# Patient Record
Sex: Male | Born: 1950 | Race: White | Hispanic: No | Marital: Married | Smoking: Never smoker
Health system: Southern US, Community
[De-identification: ages and names within clinical notes are randomized; demographics above are authoritative.]

## PROBLEM LIST (undated history)

## (undated) DIAGNOSIS — M1991 Primary osteoarthritis, unspecified site: Secondary | ICD-10-CM

## (undated) DIAGNOSIS — K219 Gastro-esophageal reflux disease without esophagitis: Secondary | ICD-10-CM

## (undated) DIAGNOSIS — M1990 Unspecified osteoarthritis, unspecified site: Secondary | ICD-10-CM

## (undated) DIAGNOSIS — J189 Pneumonia, unspecified organism: Secondary | ICD-10-CM

## (undated) DIAGNOSIS — Z87440 Personal history of urinary (tract) infections: Secondary | ICD-10-CM

## (undated) DIAGNOSIS — R112 Nausea with vomiting, unspecified: Secondary | ICD-10-CM

## (undated) DIAGNOSIS — Z8719 Personal history of other diseases of the digestive system: Secondary | ICD-10-CM

## (undated) DIAGNOSIS — T8859XA Other complications of anesthesia, initial encounter: Secondary | ICD-10-CM

## (undated) DIAGNOSIS — Z9889 Other specified postprocedural states: Secondary | ICD-10-CM

## (undated) DIAGNOSIS — Z8781 Personal history of (healed) traumatic fracture: Secondary | ICD-10-CM

## (undated) DIAGNOSIS — E063 Autoimmune thyroiditis: Secondary | ICD-10-CM

## (undated) DIAGNOSIS — D649 Anemia, unspecified: Secondary | ICD-10-CM

## (undated) DIAGNOSIS — M75101 Unspecified rotator cuff tear or rupture of right shoulder, not specified as traumatic: Secondary | ICD-10-CM

## (undated) HISTORY — DX: Gastro-esophageal reflux disease without esophagitis: K21.9

## (undated) HISTORY — DX: Unspecified rotator cuff tear or rupture of right shoulder, not specified as traumatic: M75.101

## (undated) HISTORY — DX: Autoimmune thyroiditis: E06.3

## (undated) HISTORY — PX: HERNIA REPAIR: SHX51

## (undated) HISTORY — PX: BACK SURGERY: SHX140

## (undated) HISTORY — DX: Primary osteoarthritis, unspecified site: M19.91

---

## 1999-06-01 ENCOUNTER — Inpatient Hospital Stay (HOSPITAL_COMMUNITY): Admission: EM | Admit: 1999-06-01 | Discharge: 1999-06-04 | Payer: Self-pay | Admitting: Emergency Medicine

## 1999-06-01 ENCOUNTER — Encounter: Payer: Self-pay | Admitting: Emergency Medicine

## 1999-06-22 ENCOUNTER — Encounter: Admission: RE | Admit: 1999-06-22 | Discharge: 1999-06-22 | Payer: Self-pay | Admitting: Neurosurgery

## 1999-06-22 ENCOUNTER — Encounter: Payer: Self-pay | Admitting: Neurosurgery

## 1999-08-04 ENCOUNTER — Encounter: Admission: RE | Admit: 1999-08-04 | Discharge: 1999-08-04 | Payer: Self-pay | Admitting: Neurosurgery

## 1999-08-04 ENCOUNTER — Encounter: Payer: Self-pay | Admitting: Neurosurgery

## 1999-09-17 ENCOUNTER — Encounter: Payer: Self-pay | Admitting: Neurosurgery

## 1999-09-17 ENCOUNTER — Encounter: Admission: RE | Admit: 1999-09-17 | Discharge: 1999-09-17 | Payer: Self-pay | Admitting: Neurosurgery

## 1999-10-25 ENCOUNTER — Encounter: Admission: RE | Admit: 1999-10-25 | Discharge: 1999-10-25 | Payer: Self-pay | Admitting: Neurosurgery

## 1999-10-25 ENCOUNTER — Encounter: Payer: Self-pay | Admitting: Neurosurgery

## 2003-06-05 ENCOUNTER — Ambulatory Visit (HOSPITAL_COMMUNITY): Admission: RE | Admit: 2003-06-05 | Discharge: 2003-06-05 | Payer: Self-pay | Admitting: Family Medicine

## 2003-07-23 ENCOUNTER — Ambulatory Visit (HOSPITAL_COMMUNITY): Admission: RE | Admit: 2003-07-23 | Discharge: 2003-07-23 | Payer: Self-pay | Admitting: Internal Medicine

## 2004-06-17 ENCOUNTER — Ambulatory Visit (HOSPITAL_COMMUNITY): Admission: RE | Admit: 2004-06-17 | Discharge: 2004-06-17 | Payer: Self-pay | Admitting: Family Medicine

## 2004-09-03 ENCOUNTER — Ambulatory Visit (HOSPITAL_COMMUNITY): Admission: RE | Admit: 2004-09-03 | Discharge: 2004-09-03 | Payer: Self-pay | Admitting: Family Medicine

## 2005-02-07 ENCOUNTER — Emergency Department (HOSPITAL_COMMUNITY): Admission: EM | Admit: 2005-02-07 | Discharge: 2005-02-07 | Payer: Self-pay | Admitting: Emergency Medicine

## 2005-04-21 ENCOUNTER — Emergency Department (HOSPITAL_COMMUNITY): Admission: EM | Admit: 2005-04-21 | Discharge: 2005-04-21 | Payer: Self-pay | Admitting: Emergency Medicine

## 2007-05-25 ENCOUNTER — Ambulatory Visit (HOSPITAL_COMMUNITY): Admission: RE | Admit: 2007-05-25 | Discharge: 2007-05-25 | Payer: Self-pay | Admitting: Family Medicine

## 2007-12-17 ENCOUNTER — Ambulatory Visit (HOSPITAL_COMMUNITY): Admission: RE | Admit: 2007-12-17 | Discharge: 2007-12-17 | Payer: Self-pay | Admitting: Family Medicine

## 2008-04-03 ENCOUNTER — Ambulatory Visit: Payer: Self-pay | Admitting: Internal Medicine

## 2008-04-18 ENCOUNTER — Ambulatory Visit (HOSPITAL_COMMUNITY): Admission: RE | Admit: 2008-04-18 | Discharge: 2008-04-18 | Payer: Self-pay | Admitting: Internal Medicine

## 2008-04-18 ENCOUNTER — Ambulatory Visit: Payer: Self-pay | Admitting: Internal Medicine

## 2008-04-18 ENCOUNTER — Encounter: Payer: Self-pay | Admitting: Internal Medicine

## 2008-04-18 HISTORY — PX: COLONOSCOPY: SHX174

## 2008-04-18 HISTORY — PX: ESOPHAGOGASTRODUODENOSCOPY: SHX1529

## 2008-08-26 ENCOUNTER — Ambulatory Visit: Payer: Self-pay | Admitting: Internal Medicine

## 2008-09-18 ENCOUNTER — Ambulatory Visit (HOSPITAL_COMMUNITY): Admission: RE | Admit: 2008-09-18 | Discharge: 2008-09-18 | Payer: Self-pay | Admitting: Family Medicine

## 2009-04-30 ENCOUNTER — Encounter: Payer: Self-pay | Admitting: Urgent Care

## 2010-06-30 ENCOUNTER — Ambulatory Visit (HOSPITAL_COMMUNITY)
Admission: RE | Admit: 2010-06-30 | Discharge: 2010-06-30 | Payer: Self-pay | Source: Home / Self Care | Attending: Orthopedic Surgery | Admitting: Orthopedic Surgery

## 2010-07-18 HISTORY — PX: OTHER SURGICAL HISTORY: SHX169

## 2010-08-07 ENCOUNTER — Encounter: Payer: Self-pay | Admitting: Family Medicine

## 2010-08-08 ENCOUNTER — Encounter: Payer: Self-pay | Admitting: Family Medicine

## 2010-08-19 ENCOUNTER — Encounter (INDEPENDENT_AMBULATORY_CARE_PROVIDER_SITE_OTHER): Payer: Self-pay | Admitting: *Deleted

## 2010-08-25 NOTE — Letter (Signed)
Summary: Recall Office Visit  [facility]  [address]   [place], [place] [address]   Phone: [phone]  Fax: [phone]      August 19, 2010   DAKOTAH ORREGO [address] [place], [place]  [address] 11/18/1950   Dear Mr. GASAWAY,   According to our records, it is time for you to schedule a follow-up office visit with [place].   At your convenience, please call [phone] to schedule an office visit. If you have any questions, concerns, or feel that this letter is in error, we would appreciate your call.   Sincerely,    Diana Eves  [facility] Ph: [phone]   Fax: [phone]

## 2010-11-30 NOTE — Op Note (Signed)
NAME:  Antonio, Daniel             ACCOUNT NO.:  [id number]   MEDICAL RECORD NO.:  [id number]          PATIENT TYPE:  AMB   LOCATION:  DAY                           FACILITY:  [facility]   PHYSICIAN:  R. Roetta Sessions, M.D. DATE OF BIRTH:  1950/07/20   DATE OF PROCEDURE:  DATE OF DISCHARGE:                               OPERATIVE REPORT   EGD with Elease Hashimoto dilation followed by biopsy followed by screening  colonoscopy.   INDICATIONS FOR PROCEDURE:  A 60 year old gentleman with long-standing  esophageal dysphagia, saw him back in 2005.  Did an EGD, found a  Schatzki ring.  It was dilated up to a 58-French size back then, but  tells me that dysphagia did not really improve at all.  He has some  degree of reflux symptoms for which she is taking a variety of PPIs and  Maalox with minimal improvement in symptoms.  He is here for repeat  examination and esophageal dilation, further mastication as appropriate  as well as a screening colonoscopy.  He has never had a colonoscopy, and  he has no family history for colorectal neoplasia.  He has no lower GI  tract symptoms.  Risks, benefits, alternative, limitations of this  approach have been reviewed, questions answered.  Please see  documentation in the medical record.   PROCEDURE NOTE:  O2 saturation, blood pressure, pulse, and respirations  were monitored throughout the entire procedure.   CONSCIOUS SEDATION:  Versed 5 mg IV, Demerol 125 mg IV in divided doses.  Cetacaine spray for topical pharyngeal anesthesia.   INSTRUMENTATION:  Pentax video chip system.   EGD FINDINGS:  Examination of the tubular esophagus revealed somewhat  pale-appearing esophageal mucosa.  There were 3 distal rings with  overlying esophageal erosion on the distal ring.  I did not really see  any furrowing and would not call this a feline esophagus, although it  did have 3 distal rings,  I passed through this with the scope without  any difficulty whatsoever.   Otherwise, the esophageal mucosa appeared  unremarkable.   STOMACH:  Gas cavity was emptied, insufflated well with air.  A thorough  examination of the gastric mucosa including retroflexion of the proximal  stomach esophagogastric junction demonstrated only a small hiatal  hernia.  Pylorus patent, easily traversed.  Examination of the bulb and  second portion revealed no abnormalities.   THERAPEUTIC/DIAGNOSTIC MANEUVERS PERFORMED:  Scope was withdrawn, and a  56-French Maloney dilator was passed to full insertion with ease.  A  look back revealed what appeared to be a superficial tear of the  esophageal mucosa straddling the distal ring at the EG junction,  otherwise no apparent complication related to passage of the dilator.  I  elected not to go with any larger border dilators.  I subsequently  biopsied the distal and mid esophageal body to evaluate further for  possible eosinophilic esophagitis.  The patient tolerated the procedure  well and was prepared for colonoscopy.  Digital rectal exam revealed no  abnormalities.   ENDOSCOPIC FINDINGS:  The prep was good.   COLON:  Colonic mucosa was surveyed  from the rectosigmoid junction  through the left transverse, right colon, appendiceal orifice, ileocecal  valve, and cecum.  These structures were well seen and photographed for  the record.  From this level, the scope was slowly withdrawn.  All  previously mentioned mucosal surfaces were again seen.  The patient had  scattered left-sided diverticula and colonic mucosa appeared normal.  Scope was pulled down the rectum.  A thorough examination of the rectal  mucosa including retroflexion of the distal rectum revealed no  abnormalities.  The patient tolerated both procedures well and was  reactive in endoscopy.   IMPRESSION:  Esophagogastroduodenoscopy, somewhat pale-appearing  esophageal mucosa, 3 distal esophageal ring status post dilation  described above status post esophageal  biopsy, small hiatal hernia  otherwise normal stomach, D1 and D2.   COLONOSCOPY FINDINGS:  Normal rectum, scattered left-sided diverticula,  colonic mucosa appeared normal.   RECOMMENDATIONS:  1. Diverticulosis.  Literature provided to Antonio Daniel.  2. Followup on path.  3. Carafate 1 g slurry, use q.i.d. x3 days.  Get back on a PPI with      Prilosec 20 g orally daily.  I have discussed my findings and      recommendations with Antonio Daniel and his girlfriend, Tammy.      Further recommendations to follow.      Jonathon Bellows, M.D.  Electronically Signed     RMR/MEDQ  D:  04/18/2008  T:  04/18/2008  Job:  [id number]   cc:   Corrie Mckusick, M.D.  Fax: [phone]
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
## 2010-11-30 NOTE — Assessment & Plan Note (Signed)
NAME:  Antonio Daniel, Antonio Daniel              CHART#:  [id number]   DATE:  08/26/2008                       DOB:  [birth year]   PROBLEM LIST:  Gastroesophageal reflux disease, history of esophageal  dysphagia resolved status post dilation Schatzki's ring 04/18/2008.  The  patient has done very well.  Reflux symptoms have been fairly well  squelched with Prilosec 20 mg orally daily.  He is out.  He is having no  further dysphagia.  His esophagus did appear somewhat pale and he  actually had three distal rings in tandem.  After dilation I did perform  biopsies and features were most consistent of reflux esophagitis and not  eosinophilic esophagitis.  Left-sided diverticula otherwise normal colon  and rectum on 04/18/2008.  Screening colonoscopy due for routine  screening in 10 years.   MEDICATIONS:  See updated list.   ALLERGIES:  No known drug allergies.   PHYSICAL EXAMINATION:  GENERAL:  He appears well.  VITAL SIGNS:  Weight 184, height 5 feet 9 inches.  Temperature 98.1, BP  110/70, pulse 72.  SKIN:  Warm and dry.  CHEST:  Lungs are clear to auscultation.  CARDIAC:  Regular rate and rhythm without murmur, gallop or rub.  ABDOMEN:  Nondistended, positive bowel sounds, soft, nontender.   ASSESSMENT:  1. Gastroesophageal reflux disease symptoms much improved on proton      pump inhibitor therapy with omeprazole.  2. Dysphagia resolved status post dilation of his rings.   RECOMMENDATIONS:  Emphasize the chronic nature of gastroesophageal  reflux disease and the need to be on chronic acid suppression therapy.  He has done well on Prilosec.  We will give him a refill for omeprazole  20 mg orally daily (2 years).  He may or may not, I told him he may or  may not need another dilation in the future, any recurrence of dysphagia  symptoms would  dictate that approach.  Unless something comes up the plan is to see  this nice gentleman back in the office in 2 years.       Jonathon Bellows,  M.D.  Electronically Signed     RMR/MEDQ  D:  08/27/2008  T:  08/27/2008  Job:  [id number]   cc:   Dr Phillips Odor
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
## 2010-12-03 NOTE — Consult Note (Signed)
NAME:  Antonio Daniel, Antonio Daniel NO.:  [id number]   MEDICAL RECORD NO.:  [id number]                  PATIENT TYPE:   LOCATION:                                       FACILITY:   PHYSICIAN:  Lionel December, M.D.                 DATE OF BIRTH:  05-26-1951   DATE OF CONSULTATION:  07/02/2003  DATE OF DISCHARGE:                                   CONSULTATION   REQUESTING PHYSICIAN:  Corrie Mckusick, M.D.   CONSULTING PHYSICIAN:  Lionel December, M.D.   REASON FOR CONSULTATION:  Dysphagia, abnormal esophogram.   HISTORY OF PRESENT ILLNESS:  Antonio Daniel is a 60 year old Caucasian  gentleman, a patient of Dr. Assunta Daniel, who presents today for further  evaluation of the above stated symptoms.  He has had chronic dysphagia to  solid foods for several months to a year.  He particularly notes this when  eating meats or breads.  A few times, he has had to force the food back  pulse because it would not go down.  He has also had heartburn for more than  five years.  He takes Maalox tablets p.r.n.  He denies any odynophagia,  nausea, abdominal pain, constipation, diarrhea, melena or rectal bleeding.  He recent had a barium esophogram which revealed a small sliding hiatal  hernia with a Schatzki ring seen above the hernia, measuring 16- to -17-mm  in diameter.  A 13-mm barium tablet passed easily.   CURRENT MEDICATIONS:  1. Advil p.r.n.  2. Maalox tablets p.r.n.   ALLERGIES:  No known drug allergies.   PAST MEDICAL HISTORY:  Recent history of UTI/prostatitis.   PAST SURGICAL HISTORY:  Left inguinal hernia repair.   FAMILY HISTORY:  Mother died of leukemia at age 84.  Father died of  pancreatic cancer age 87.  No family history of colorectal cancer to his  knowledge.   SOCIAL HISTORY:  He is divorced.  He has two children.  He works with [employer].  He has never been a smoker.  Denies any alcohol  use.   REVIEW OF SYSTEMS:  Please see HPI for  GI.  GENERAL:  Denies any weight  loss.  CARDIOPULMONARY:  Denies any chest pain or shortness of breath.   PHYSICAL EXAMINATION:  VITAL SIGNS:  Weight 181, height 5 foot 9, temp 98.3,  blood pressure 124/88, pulse 72.  GENERAL:  A pleasant, well nourished, well developed Caucasian male in no  acute distress.  SKIN:  Warm and dry.  No jaundice.  HEENT:  Conjunctivae are pink.  Sclerae are nonicteric.  Oropharyngeal  mucosa is moist and pink.  No lesions, erythema or exudate.  No  lymphadenopathy, thyromegaly.  CHEST:  Lungs are clear to auscultation.  CARDIAC:  Reveals a regular, rate and rhythm.  Normal S1, S2.  No murmurs,  rubs or gallops.  ABDOMEN:  Positive bowel sounds.  Soft,  nontender, nondistended.  No  organomegaly or masses.  EXTREMITIES:  No edema.   IMPRESSION:  The patient is a 60 year old gentleman with chronic  gastroesophageal reflux disease and chronic solid food esophageal dysphagia.  He has a Schatzki ring on the barium esophogram with a 13-mm barium tablet  passing easily, with larger boluses, this ring may be an issue however.   RECOMMENDATIONS:  Recommend upper endoscopy both for esophageal dilatation  as well as surveillance for complications of GERD, i.e., Barrett's  esophagus.   PLAN:  1. EGD with dilatation near future.  2. He will begin Protonix 40 mg daily, #4 boxes of samples given.  3. Further recommendations to follow.   I would like to thank Dr. Assunta Daniel for allowing [place] to take part in the  care of this patient.     ________________________________________  ___________________________________________  Tana Coast, P.A.                         Lionel December, M.D.   LL/MEDQ  D:  07/02/2003  T:  07/02/2003  Job:  [id number]   cc:   Corrie Mckusick, M.D.  [address] [place] [place]  [place]  [place] [address]  Fax: [phone]   Lionel December, M.D.  [address]    [place] [address]  Fax: [phone]

## 2010-12-03 NOTE — Op Note (Signed)
NAME:  KYMANI, SHIMABUKURO                       ACCOUNT NO.:  [id number]   MEDICAL RECORD NO.:  [id number]                   PATIENT TYPE:  AMB   LOCATION:  DAY                                  FACILITY:  [facility]   PHYSICIAN:  Lionel December, M.D.                 DATE OF BIRTH:  Sep 17, 1950   DATE OF PROCEDURE:  DATE OF DISCHARGE:                                 OPERATIVE REPORT   I just dictated a note.  I just wanted to make sure that Dr. Phillips Odor gets a  copy.      ___________________________________________                                            Lionel December, M.D.   NR/MEDQ  D:  07/23/2003  T:  07/23/2003  Job:  [id number]   cc:   Corrie Mckusick, M.D.  [address] [place] [place]  [place]  [place] [address]  Fax: [phone]

## 2010-12-03 NOTE — Op Note (Signed)
NAME:  Antonio Daniel, MAKER                       ACCOUNT NO.:  [id number]   MEDICAL RECORD NO.:  [id number]                   PATIENT TYPE:  AMB   LOCATION:  DAY                                  FACILITY:  [facility]   PHYSICIAN:  Lionel December, M.D.                 DATE OF BIRTH:  Mar 08, 1951   DATE OF PROCEDURE:  DATE OF DISCHARGE:                                 OPERATIVE REPORT   PROCEDURE:  Esophagogastroduodenoscopy with esophageal dilatation.   ENDOSCOPIST:  Lionel December, M.D.   INDICATIONS:  Gabriel Rung is a 60 year old Caucasian male with a several year  history of intermittent solid food dysphagia who recently had a barium study  which revealed a small sliding hiatal hernia and Schatzki ring.  However,  this was not felt to be critical and barium filled past this area without  any delay.  It is felt that he would benefit from having this ring  disrupted. He also has intermittent heartburn.  This is better with PPI.   Procedure and risks were reviewed with the patient and informed consent was  obtained.   PREOPERATIVE MEDICATIONS:  Cetacaine spray for oropharyngeal topical  anesthesia, Demerol 50 mg IV and Versed 6 mg IV in divided dose.   FINDINGS:  Procedure performed in endoscopy suite.  The patient's vital  signs and O2 saturation were monitored during the procedure and remained  stable.  The patient was placed in the left lateral recumbent position and  Olympus videoscope was passed via the oropharynx without any difficulty into  the esophagus.   ESOPHAGUS:  Mucosa of the esophagus was normal.  There was an incomplete  ring about 2 cm proximal to the GE junction and a Schatzki ring at the GE  junction which was not high grade. There was a 4 cm long, sliding, hiatal  hernia.  Diaphragmatic hiatus was at 41 cm from incisors.   STOMACH:  It was empty and distended very well with insufflation.  The folds  of the proximal stomach were normal.  Examination of the mucosa revealed  antral erythema and granularity.  The pyloric channel was patent.  Angularis, fundus, and cardia were examined by retroflexing the scope and  were normal.   DUODENUM:  Examination of the bulb revealed patchy erythema and granularity,  but no erosions or ulcers noted.  The mucosa and folds in the second part of  the duodenum were normal. Endoscope was withdrawn.   The esophagus was dilated by passing 58 [place] Maloney dilator to full  insertion.  The esophagus was reexamined post dilatation and the ring was  noted to have been disrupted.  Pictures taken for the record.   The endoscope was withdrawn.  The patient tolerated the procedure well.   FINAL DIAGNOSES:  1. Schatzki ring just dilated and disrupted by passing 58 [place] Maloney     dilator.  2. Small, sliding, hiatal hernia.  3. Nonerosive antral gastritis  and bulbar duodenitis.   RECOMMENDATIONS:  1. Antireflux measures stressed to the patient.  2. Omeprazole 20 mg p.o. q.a.m.  3. H. pylori serology will be checked today. I will be contacting the     patient with the results.      ___________________________________________                                            Lionel December, M.D.   NR/MEDQ  D:  07/23/2003  T:  07/23/2003  Job:  [id number]

## 2012-05-16 ENCOUNTER — Other Ambulatory Visit (HOSPITAL_COMMUNITY): Payer: Self-pay | Admitting: Chiropractic Medicine

## 2012-05-16 DIAGNOSIS — M999 Biomechanical lesion, unspecified: Secondary | ICD-10-CM

## 2012-05-18 ENCOUNTER — Ambulatory Visit (HOSPITAL_COMMUNITY)
Admission: RE | Admit: 2012-05-18 | Discharge: 2012-05-18 | Disposition: A | Discharge status: Home / Self Care | Payer: 59 | Source: Ambulatory Visit | Attending: Chiropractic Medicine | Admitting: Chiropractic Medicine

## 2012-05-18 DIAGNOSIS — M999 Biomechanical lesion, unspecified: Secondary | ICD-10-CM

## 2012-05-18 DIAGNOSIS — S83289A Other tear of lateral meniscus, current injury, unspecified knee, initial encounter: Secondary | ICD-10-CM | POA: Insufficient documentation

## 2012-05-18 DIAGNOSIS — M25569 Pain in unspecified knee: Secondary | ICD-10-CM | POA: Insufficient documentation

## 2012-05-18 DIAGNOSIS — IMO0002 Reserved for concepts with insufficient information to code with codable children: Secondary | ICD-10-CM | POA: Insufficient documentation

## 2012-05-18 DIAGNOSIS — M25469 Effusion, unspecified knee: Secondary | ICD-10-CM | POA: Insufficient documentation

## 2012-05-18 DIAGNOSIS — M7120 Synovial cyst of popliteal space [Baker], unspecified knee: Secondary | ICD-10-CM | POA: Insufficient documentation

## 2012-05-18 DIAGNOSIS — X58XXXA Exposure to other specified factors, initial encounter: Secondary | ICD-10-CM | POA: Insufficient documentation

## 2013-01-28 ENCOUNTER — Other Ambulatory Visit (HOSPITAL_COMMUNITY): Payer: Self-pay | Admitting: Chiropractic Medicine

## 2013-01-28 DIAGNOSIS — M545 Low back pain: Secondary | ICD-10-CM

## 2013-01-29 ENCOUNTER — Ambulatory Visit (HOSPITAL_COMMUNITY)
Admission: RE | Admit: 2013-01-29 | Discharge: 2013-01-29 | Disposition: A | Discharge status: Home / Self Care | Payer: 59 | Source: Ambulatory Visit | Attending: Chiropractic Medicine | Admitting: Chiropractic Medicine

## 2013-01-29 DIAGNOSIS — M5126 Other intervertebral disc displacement, lumbar region: Secondary | ICD-10-CM | POA: Insufficient documentation

## 2013-01-29 DIAGNOSIS — M5137 Other intervertebral disc degeneration, lumbosacral region: Secondary | ICD-10-CM | POA: Insufficient documentation

## 2013-01-29 DIAGNOSIS — M545 Low back pain, unspecified: Secondary | ICD-10-CM | POA: Insufficient documentation

## 2013-01-29 DIAGNOSIS — IMO0002 Reserved for concepts with insufficient information to code with codable children: Secondary | ICD-10-CM | POA: Insufficient documentation

## 2013-01-29 DIAGNOSIS — M51379 Other intervertebral disc degeneration, lumbosacral region without mention of lumbar back pain or lower extremity pain: Secondary | ICD-10-CM | POA: Insufficient documentation

## 2013-04-23 ENCOUNTER — Other Ambulatory Visit: Payer: Self-pay | Admitting: Orthopedic Surgery

## 2013-05-02 ENCOUNTER — Other Ambulatory Visit: Payer: Self-pay | Admitting: Orthopedic Surgery

## 2013-05-03 ENCOUNTER — Encounter (HOSPITAL_COMMUNITY): Payer: Self-pay | Admitting: Pharmacy Technician

## 2013-05-08 ENCOUNTER — Encounter (HOSPITAL_COMMUNITY)
Admission: RE | Admit: 2013-05-08 | Discharge: 2013-05-08 | Disposition: A | Discharge status: Home / Self Care | Payer: 59 | Source: Ambulatory Visit | Attending: Orthopedic Surgery | Admitting: Orthopedic Surgery

## 2013-05-08 ENCOUNTER — Encounter (HOSPITAL_COMMUNITY): Payer: Self-pay

## 2013-05-08 DIAGNOSIS — Z01812 Encounter for preprocedural laboratory examination: Secondary | ICD-10-CM | POA: Insufficient documentation

## 2013-05-08 HISTORY — DX: Unspecified osteoarthritis, unspecified site: M19.90

## 2013-05-08 LAB — SURGICAL PCR SCREEN
MRSA, PCR: NEGATIVE
Staphylococcus aureus: POSITIVE — AB

## 2013-05-08 LAB — PROTIME-INR: Prothrombin Time: 12.8 seconds (ref 11.6–15.2)

## 2013-05-08 LAB — ABO/RH: ABO/RH(D): O POS

## 2013-05-08 NOTE — Patient Instructions (Signed)
YOUR SURGERY IS SCHEDULED AT [facility]  ON:  Monday  10/27  REPORT TO [facility] AT:  7:30 AM      PHONE # FOR SHORT STAY IS [phone]  DO NOT EAT OR DRINK ANYTHING AFTER MIDNIGHT THE NIGHT BEFORE YOUR SURGERY.  YOU MAY BRUSH YOUR TEETH, RINSE OUT YOUR MOUTH--BUT NO WATER, NO FOOD, NO CHEWING GUM, NO MINTS, NO CANDIES, NO CHEWING TOBACCO.  PLEASE TAKE THE FOLLOWING MEDICATIONS THE AM OF YOUR SURGERY WITH A FEW SIPS OF WATER:  HYDROCODONE  / ACETAMINOPHEN  FOR PAIN IF NEEDED     DO NOT BRING VALUABLES, MONEY, CREDIT CARDS.  DO NOT WEAR JEWELRY, MAKE-UP, NAIL POLISH AND NO METAL PINS OR CLIPS IN YOUR HAIR. CONTACT LENS, DENTURES / PARTIALS, GLASSES SHOULD NOT BE WORN TO SURGERY AND IN MOST CASES-HEARING AIDS WILL NEED TO BE REMOVED.  BRING YOUR GLASSES CASE, ANY EQUIPMENT NEEDED FOR YOUR CONTACT LENS. FOR PATIENTS ADMITTED TO THE HOSPITAL--CHECK OUT TIME THE DAY OF DISCHARGE IS 11:00 AM.  ALL INPATIENT ROOMS ARE PRIVATE - WITH BATHROOM, TELEPHONE, TELEVISION AND WIFI INTERNET.                               PLEASE READ OVER ANY  FACT SHEETS THAT YOU WERE GIVEN: MRSA INFORMATION, BLOOD TRANSFUSION INFORMATION, INCENTIVE SPIROMETER INFORMATION. FAILURE TO FOLLOW THESE INSTRUCTIONS MAY RESULT IN THE CANCELLATION OF YOUR SURGERY.   PATIENT SIGNATURE_________________________________

## 2013-05-08 NOTE — Pre-Procedure Instructions (Signed)
PT HAS CBC, DIFF, CMET, UA, EKG REPORTS ON HIS CHART FROM [facility] AND CLEARANCE FROM [facility]. PT, PTT, T/S WERE DONE TODAY AS PER ORDERS--PT DID NOT NEED CXR.

## 2013-05-12 ENCOUNTER — Other Ambulatory Visit: Payer: Self-pay | Admitting: Orthopedic Surgery

## 2013-05-12 NOTE — H&P (Signed)
Antonio Daniel. Mccraw  DOB: 12/27/1950 Married / Language: English / Race: Refused to Report/Unreported Male  Date of Admission:  05/13/2013  Chief Complaint:  Right Knee Pain  History of Present Illness The patient is a 62 year old male who comes in today for a preoperative History and Physical. The patient is scheduled for a right total knee arthroplasty to be performed by Dr. Gus Rankin. Aluisio, MD at [facility] on 05/13/2013. The patient is a 62 year old male who presents for follow up of their knee. The patient is being followed for their right knee pain and osteoarthritis. Symptoms reported today include: pain, swelling (slight), pain in calf, aching, popping and grinding. The patient feels that they are doing poorly and report their pain level to be severe. Current treatment includes: NSAIDs and pain medications. The following medication has been used for pain control: antiinflammatory medication and Hydrocodone (needs refill). The patient has not gotten any relief of their symptoms with Cortisone injections (injection previously by Dr Lequita Halt did help). The patient indicates that he is ready to proceed with surgery. The knee is getting progressively worse to the point where he can not tolerate it anymore. It is limiting what he can and can not do. It is getting harder to do his work. The cortisone injection when it did work helped for only a couple of weeks. The last cortisone injection did not help at all. He is at a stage now where he feels like he needs to get this fixed. He wishes to proceed with knee replacement. They have been treated conservatively in the past for the above stated problem and despite conservative measures, they continue to have progressive pain and severe functional limitations and dysfunction. They have failed non-operative management including home exercise, medications, and injections. It is felt that they would benefit from undergoing total joint  replacement. Risks and benefits of the procedure have been discussed with the patient and they elect to proceed with surgery. There are no active contraindications to surgery such as ongoing infection or rapidly progressive neurological disease.   Problem List Primary osteoarthritis of one knee, right (715.16)    Allergies No Known Drug Allergies    Family History Osteoarthritis. sister Father. Deceased. age 61, Pancreatic Cancer Mother. Deceased. age 19, Leukemia    Social History Exercise. Exercises rarely; does other Illicit drug use. no Children. 2 Living situation. live with spouse Current work status. working full time Number of flights of stairs before winded. 4-5 Most recent primary occupation. welder Marital status. married Previously in rehab. no Drug/Alcohol Rehab (Currently). no Tobacco use. never smoker Tobacco / smoke exposure. no Post-Surgical Plans. Plan is to go home.    Medication History Hydrocodone Active. Ibuprofen Active.    Past Surgical History Spinal Surgery Arthroscopic Knee Surgery - Right    Medical History Diverticulitis Of Colon Gastroesophageal Reflux Disease    Review of Systems General:Not Present- Chills, Fever, Night Sweats, Fatigue, Weight Gain, Weight Loss and Memory Loss. Skin:Not Present- Hives, Itching, Rash, Eczema and Lesions. HEENT:Not Present- Tinnitus, Headache, Double Vision, Visual Loss, Hearing Loss and Dentures. Respiratory:Not Present- Shortness of breath with exertion, Shortness of breath at rest, Allergies, Coughing up blood and Chronic Cough. Cardiovascular:Not Present- Chest Pain, Racing/skipping heartbeats, Difficulty Breathing Lying Down, Murmur, Swelling and Palpitations. Gastrointestinal:Not Present- Bloody Stool, Heartburn, Abdominal Pain, Vomiting, Nausea, Constipation, Diarrhea, Difficulty Swallowing, Jaundice and Loss of appetitie. Male Genitourinary:Not  Present- Urinary frequency, Blood in Urine, Weak urinary stream, Discharge, Flank Pain, Incontinence,  Painful Urination, Urgency, Urinary Retention and Urinating at Night. Musculoskeletal:Present- Muscle Weakness, Joint Swelling, Joint Pain, Morning Stiffness and Spasms. Not Present- Muscle Pain and Back Pain. Neurological:Not Present- Tremor, Dizziness, Blackout spells, Paralysis, Difficulty with balance and Weakness. Psychiatric:Not Present- Insomnia.    Vitals Weight: 175 lb Height: 67 in Body Surface Area: 1.94 m Body Mass Index: 27.41 kg/m Pulse: 64 (Regular) Resp.: 12 (Unlabored) BP: 110/64 (Sitting, Right Arm, Standard)     Physical Exam The physical exam findings are as follows:   General Mental Status - Alert, cooperative and good historian. General Appearance- pleasant. Not in acute distress. Orientation- Oriented X3. Build & Nutrition- Well nourished and Well developed.   Head and Neck Head- normocephalic, atraumatic . Neck Global Assessment- supple. no bruit auscultated on the right and no bruit auscultated on the left.   Eye Vision- Wears corrective lenses. Pupil- Bilateral- Regular and Round. Motion- Bilateral- EOMI.   Chest and Lung Exam Auscultation: Breath sounds:- clear at anterior chest wall and - clear at posterior chest wall. Adventitious sounds:- No Adventitious sounds.   Cardiovascular Auscultation:Rhythm- Regular rate and rhythm. Heart Sounds- S1 WNL and S2 WNL. Murmurs & Other Heart Sounds:Auscultation of the heart reveals - No Murmurs.   Abdomen Palpation/Percussion:Tenderness- Abdomen is non-tender to palpation. Rigidity (guarding)- Abdomen is soft. Auscultation:Auscultation of the abdomen reveals - Bowel sounds normal.   Male Genitourinary  Not done, not pertinent to present illness  Musculoskeletal On exam he is alert and oriented in no apparent distress. His hips shows normal range  of motion, no discomfort. Right knee no effusion. Slight varus deformity. Range 5 to 120. Marked crepitus on range of motion. Tenderness medial greater than lateral with no instability. Left knee exam is unremarkable.  RADIOGRAPHS: x-rays from last December and he was already bone on bone medial and patellofemoral.   Assessment & Plan Primary osteoarthritis of one knee, right (715.16)  Note: Plan is for a Right Total Knee Replacement by Dr. Lequita Halt.  Plan is to go home.  PCP - [facility] - Patient has been seen preoperatively and felt to be stable for surgery. Dwyane Luo, PA-C  The patient does not have any contraindications and will receive TXA (tranexamic acid) prior to surgery.  Signed electronically by Lauraine Rinne, III PA-C

## 2013-05-13 ENCOUNTER — Encounter (HOSPITAL_COMMUNITY): Payer: 59 | Admitting: Anesthesiology

## 2013-05-13 ENCOUNTER — Encounter (HOSPITAL_COMMUNITY)
Admission: RE | Disposition: A | Discharge status: Home Health | Payer: Self-pay | Source: Ambulatory Visit | Attending: Orthopedic Surgery

## 2013-05-13 ENCOUNTER — Inpatient Hospital Stay (HOSPITAL_COMMUNITY): Payer: 59 | Admitting: Anesthesiology

## 2013-05-13 ENCOUNTER — Encounter (HOSPITAL_COMMUNITY): Payer: Self-pay | Admitting: *Deleted

## 2013-05-13 ENCOUNTER — Inpatient Hospital Stay (HOSPITAL_COMMUNITY)
Admission: RE | Admit: 2013-05-13 | Discharge: 2013-05-15 | DRG: 470 | Disposition: A | Discharge status: Home Health | Payer: 59 | Source: Ambulatory Visit | Attending: Orthopedic Surgery | Admitting: Orthopedic Surgery

## 2013-05-13 DIAGNOSIS — M179 Osteoarthritis of knee, unspecified: Secondary | ICD-10-CM | POA: Diagnosis present

## 2013-05-13 DIAGNOSIS — Z96651 Presence of right artificial knee joint: Secondary | ICD-10-CM

## 2013-05-13 DIAGNOSIS — Z01812 Encounter for preprocedural laboratory examination: Secondary | ICD-10-CM

## 2013-05-13 DIAGNOSIS — M1710 Unilateral primary osteoarthritis, unspecified knee: Principal | ICD-10-CM | POA: Diagnosis present

## 2013-05-13 DIAGNOSIS — K219 Gastro-esophageal reflux disease without esophagitis: Secondary | ICD-10-CM | POA: Diagnosis present

## 2013-05-13 HISTORY — PX: TOTAL KNEE ARTHROPLASTY: SHX125

## 2013-05-13 LAB — TYPE AND SCREEN

## 2013-05-13 SURGERY — ARTHROPLASTY, KNEE, TOTAL
Anesthesia: Spinal | Site: Knee | Laterality: Right | Wound class: Clean

## 2013-05-13 MED ORDER — MEPERIDINE HCL 50 MG/ML IJ SOLN: 6.2500 mg | INTRAMUSCULAR | Status: DC | PRN

## 2013-05-13 MED ORDER — ACETAMINOPHEN 500 MG PO TABS
1000.0000 mg | ORAL_TABLET | Freq: Four times a day (QID) | ORAL | Status: AC
  Administered 2013-05-13 – 2013-05-14 (×3): 1000 mg via ORAL
  Filled 2013-05-13 (×5): qty 2

## 2013-05-13 MED ORDER — ACETAMINOPHEN 325 MG PO TABS: 650.0000 mg | ORAL_TABLET | Freq: Four times a day (QID) | ORAL | Status: DC | PRN

## 2013-05-13 MED ORDER — TRANEXAMIC ACID 100 MG/ML IV SOLN: 1000.0000 mg | INTRAVENOUS | Status: DC

## 2013-05-13 MED ORDER — PROPOFOL INFUSION 10 MG/ML OPTIME
INTRAVENOUS | Status: DC | PRN
  Administered 2013-05-13: 120 ug/kg/min via INTRAVENOUS

## 2013-05-13 MED ORDER — DEXAMETHASONE 6 MG PO TABS
10.0000 mg | ORAL_TABLET | Freq: Every day | ORAL | Status: AC
  Administered 2013-05-14: 09:00:00 10 mg via ORAL
  Filled 2013-05-13: qty 1

## 2013-05-13 MED ORDER — TRANEXAMIC ACID 100 MG/ML IV SOLN
1000.0000 mg | INTRAVENOUS | Status: AC
  Administered 2013-05-13: 1000 mg via INTRAVENOUS
  Filled 2013-05-13: qty 10

## 2013-05-13 MED ORDER — FLEET ENEMA 7-19 GM/118ML RE ENEM: 1.0000 | ENEMA | Freq: Once | RECTAL | Status: AC | PRN

## 2013-05-13 MED ORDER — SODIUM CHLORIDE 0.9 % IJ SOLN
INTRAMUSCULAR | Status: DC | PRN
  Administered 2013-05-13: 12:00:00

## 2013-05-13 MED ORDER — METOCLOPRAMIDE HCL 10 MG PO TABS: 5.0000 mg | ORAL_TABLET | Freq: Three times a day (TID) | ORAL | Status: DC | PRN

## 2013-05-13 MED ORDER — ONDANSETRON HCL 4 MG PO TABS: 4.0000 mg | ORAL_TABLET | Freq: Four times a day (QID) | ORAL | Status: DC | PRN

## 2013-05-13 MED ORDER — KETOROLAC TROMETHAMINE 15 MG/ML IJ SOLN
7.5000 mg | Freq: Four times a day (QID) | INTRAMUSCULAR | Status: AC | PRN
  Administered 2013-05-13: 16:00:00 7.5 mg via INTRAVENOUS
  Filled 2013-05-13: qty 1

## 2013-05-13 MED ORDER — SODIUM CHLORIDE 0.9 % IR SOLN
Status: DC | PRN
  Administered 2013-05-13: 1000 mL

## 2013-05-13 MED ORDER — DEXAMETHASONE SODIUM PHOSPHATE 10 MG/ML IJ SOLN
10.0000 mg | Freq: Once | INTRAMUSCULAR | Status: AC
  Administered 2013-05-13: 10 mg via INTRAVENOUS

## 2013-05-13 MED ORDER — MORPHINE SULFATE 2 MG/ML IJ SOLN: 1.0000 mg | INTRAMUSCULAR | Status: DC | PRN

## 2013-05-13 MED ORDER — METHOCARBAMOL 100 MG/ML IJ SOLN
500.0000 mg | Freq: Four times a day (QID) | INTRAVENOUS | Status: DC | PRN
  Filled 2013-05-13: qty 5

## 2013-05-13 MED ORDER — DOCUSATE SODIUM 100 MG PO CAPS
100.0000 mg | ORAL_CAPSULE | Freq: Two times a day (BID) | ORAL | Status: DC
  Administered 2013-05-13 – 2013-05-15 (×3): 100 mg via ORAL

## 2013-05-13 MED ORDER — BUPIVACAINE HCL (PF) 0.25 % IJ SOLN
INTRAMUSCULAR | Status: AC
  Filled 2013-05-13: qty 30

## 2013-05-13 MED ORDER — ACETAMINOPHEN 500 MG PO TABS
1000.0000 mg | ORAL_TABLET | Freq: Once | ORAL | Status: AC
  Administered 2013-05-13: 1000 mg via ORAL
  Filled 2013-05-13: qty 2

## 2013-05-13 MED ORDER — FENTANYL CITRATE 0.05 MG/ML IJ SOLN
INTRAMUSCULAR | Status: DC | PRN
  Administered 2013-05-13: 100 ug via INTRAVENOUS

## 2013-05-13 MED ORDER — TRAMADOL HCL 50 MG PO TABS
50.0000 mg | ORAL_TABLET | Freq: Four times a day (QID) | ORAL | Status: DC | PRN
  Administered 2013-05-14: 08:00:00 100 mg via ORAL
  Filled 2013-05-13: qty 2

## 2013-05-13 MED ORDER — HYDROMORPHONE HCL PF 1 MG/ML IJ SOLN: 0.2500 mg | INTRAMUSCULAR | Status: DC | PRN

## 2013-05-13 MED ORDER — BISACODYL 10 MG RE SUPP: 10.0000 mg | Freq: Every day | RECTAL | Status: DC | PRN

## 2013-05-13 MED ORDER — BUPIVACAINE HCL 0.25 % IJ SOLN
INTRAMUSCULAR | Status: DC | PRN
  Administered 2013-05-13: 20 mL

## 2013-05-13 MED ORDER — SODIUM CHLORIDE 0.9 % IV SOLN
INTRAVENOUS | Status: DC
  Administered 2013-05-13: 15:00:00 via INTRAVENOUS

## 2013-05-13 MED ORDER — METOCLOPRAMIDE HCL 5 MG/ML IJ SOLN
5.0000 mg | Freq: Three times a day (TID) | INTRAMUSCULAR | Status: DC | PRN
  Administered 2013-05-13: 10 mg via INTRAVENOUS
  Filled 2013-05-13: qty 2

## 2013-05-13 MED ORDER — ONDANSETRON HCL 4 MG/2ML IJ SOLN: 4.0000 mg | Freq: Four times a day (QID) | INTRAMUSCULAR | Status: DC | PRN

## 2013-05-13 MED ORDER — CHLORHEXIDINE GLUCONATE 4 % EX LIQD: 60.0000 mL | Freq: Once | CUTANEOUS | Status: DC

## 2013-05-13 MED ORDER — LACTATED RINGERS IV SOLN: INTRAVENOUS | Status: DC

## 2013-05-13 MED ORDER — CEFAZOLIN SODIUM-DEXTROSE 2-3 GM-% IV SOLR
2.0000 g | Freq: Four times a day (QID) | INTRAVENOUS | Status: AC
  Administered 2013-05-13 (×2): 2 g via INTRAVENOUS
  Filled 2013-05-13 (×2): qty 50

## 2013-05-13 MED ORDER — SODIUM CHLORIDE 0.9 % IV SOLN: INTRAVENOUS | Status: DC

## 2013-05-13 MED ORDER — CEFAZOLIN SODIUM-DEXTROSE 2-3 GM-% IV SOLR
INTRAVENOUS | Status: AC
  Filled 2013-05-13: qty 50

## 2013-05-13 MED ORDER — PROMETHAZINE HCL 25 MG/ML IJ SOLN: 6.2500 mg | INTRAMUSCULAR | Status: DC | PRN

## 2013-05-13 MED ORDER — BUPIVACAINE LIPOSOME 1.3 % IJ SUSP
20.0000 mL | Freq: Once | INTRAMUSCULAR | Status: DC
  Filled 2013-05-13: qty 20

## 2013-05-13 MED ORDER — SODIUM CHLORIDE 0.9 % IJ SOLN
INTRAMUSCULAR | Status: AC
  Filled 2013-05-13: qty 50

## 2013-05-13 MED ORDER — CEFAZOLIN SODIUM-DEXTROSE 2-3 GM-% IV SOLR
2.0000 g | INTRAVENOUS | Status: AC
  Administered 2013-05-13: 2 g via INTRAVENOUS

## 2013-05-13 MED ORDER — MENTHOL 3 MG MT LOZG: 1.0000 | LOZENGE | OROMUCOSAL | Status: DC | PRN

## 2013-05-13 MED ORDER — POLYETHYLENE GLYCOL 3350 17 G PO PACK: 17.0000 g | PACK | Freq: Every day | ORAL | Status: DC | PRN

## 2013-05-13 MED ORDER — OXYCODONE HCL 5 MG PO TABS
5.0000 mg | ORAL_TABLET | ORAL | Status: DC | PRN
  Administered 2013-05-13: 15:00:00 10 mg via ORAL
  Filled 2013-05-13: qty 2

## 2013-05-13 MED ORDER — METHOCARBAMOL 500 MG PO TABS
500.0000 mg | ORAL_TABLET | Freq: Four times a day (QID) | ORAL | Status: DC | PRN
  Administered 2013-05-14 – 2013-05-15 (×3): 500 mg via ORAL
  Filled 2013-05-13 (×3): qty 1

## 2013-05-13 MED ORDER — DEXAMETHASONE SODIUM PHOSPHATE 10 MG/ML IJ SOLN
10.0000 mg | Freq: Every day | INTRAMUSCULAR | Status: AC
  Filled 2013-05-13: qty 1

## 2013-05-13 MED ORDER — LACTATED RINGERS IV SOLN
INTRAVENOUS | Status: DC
  Administered 2013-05-13: 1000 mL via INTRAVENOUS
  Administered 2013-05-13: 11:00:00 via INTRAVENOUS

## 2013-05-13 MED ORDER — BUPIVACAINE IN DEXTROSE 0.75-8.25 % IT SOLN
INTRATHECAL | Status: DC | PRN
  Administered 2013-05-13: 15 mg via INTRATHECAL

## 2013-05-13 MED ORDER — DIPHENHYDRAMINE HCL 12.5 MG/5ML PO ELIX: 12.5000 mg | ORAL_SOLUTION | ORAL | Status: DC | PRN

## 2013-05-13 MED ORDER — ACETAMINOPHEN 650 MG RE SUPP: 650.0000 mg | Freq: Four times a day (QID) | RECTAL | Status: DC | PRN

## 2013-05-13 MED ORDER — PHENOL 1.4 % MT LIQD: 1.0000 | OROMUCOSAL | Status: DC | PRN

## 2013-05-13 MED ORDER — RIVAROXABAN 10 MG PO TABS
10.0000 mg | ORAL_TABLET | Freq: Every day | ORAL | Status: DC
  Administered 2013-05-14 – 2013-05-15 (×2): 10 mg via ORAL
  Filled 2013-05-13 (×3): qty 1

## 2013-05-13 MED ORDER — MIDAZOLAM HCL 5 MG/5ML IJ SOLN
INTRAMUSCULAR | Status: DC | PRN
  Administered 2013-05-13: 2 mg via INTRAVENOUS

## 2013-05-13 SURGICAL SUPPLY — 57 items
BAG SPEC THK2 15X12 ZIP CLS (MISCELLANEOUS) ×1
BAG ZIPLOCK 12X15 (MISCELLANEOUS) ×2 IMPLANT
BANDAGE ELASTIC 6 VELCRO ST LF (GAUZE/BANDAGES/DRESSINGS) ×2 IMPLANT
BANDAGE ESMARK 6X9 LF (GAUZE/BANDAGES/DRESSINGS) ×1 IMPLANT
BLADE SAG 18X100X1.27 (BLADE) ×2 IMPLANT
BLADE SAW SGTL 11.0X1.19X90.0M (BLADE) ×2 IMPLANT
BNDG CMPR 9X6 STRL LF SNTH (GAUZE/BANDAGES/DRESSINGS) ×1
BNDG ESMARK 6X9 LF (GAUZE/BANDAGES/DRESSINGS) ×2
BOWL SMART MIX CTS (DISPOSABLE) ×2 IMPLANT
CAPT RP KNEE ×1 IMPLANT
CEMENT HV SMART SET (Cement) ×4 IMPLANT
CLOTH BEACON ORANGE TIMEOUT ST (SAFETY) ×1 IMPLANT
CUFF TOURN SGL QUICK 34 (TOURNIQUET CUFF) ×2
CUFF TRNQT CYL 34X4X40X1 (TOURNIQUET CUFF) ×1 IMPLANT
DECANTER SPIKE VIAL GLASS SM (MISCELLANEOUS) ×2 IMPLANT
DRAPE EXTREMITY T 121X128X90 (DRAPE) ×2 IMPLANT
DRAPE POUCH INSTRU U-SHP 10X18 (DRAPES) ×2 IMPLANT
DRAPE U-SHAPE 47X51 STRL (DRAPES) ×2 IMPLANT
DRSG ADAPTIC 3X8 NADH LF (GAUZE/BANDAGES/DRESSINGS) ×2 IMPLANT
DRSG PAD ABDOMINAL 8X10 ST (GAUZE/BANDAGES/DRESSINGS) ×2 IMPLANT
DURAPREP 26ML APPLICATOR (WOUND CARE) ×2 IMPLANT
ELECT REM PT RETURN 9FT ADLT (ELECTROSURGICAL) ×2
ELECTRODE REM PT RTRN 9FT ADLT (ELECTROSURGICAL) ×1 IMPLANT
EVACUATOR 1/8 PVC DRAIN (DRAIN) ×2 IMPLANT
FACESHIELD LNG OPTICON STERILE (SAFETY) ×10 IMPLANT
GLOVE BIO SURGEON STRL SZ7.5 (GLOVE) ×5 IMPLANT
GLOVE BIO SURGEON STRL SZ8 (GLOVE) ×2 IMPLANT
GLOVE BIOGEL PI IND STRL 8 (GLOVE) ×2 IMPLANT
GLOVE BIOGEL PI INDICATOR 8 (GLOVE) ×2
GLOVE SURG SS PI 6.5 STRL IVOR (GLOVE) IMPLANT
GOWN PREVENTION PLUS LG XLONG (DISPOSABLE) ×3 IMPLANT
GOWN STRL REIN XL XLG (GOWN DISPOSABLE) ×2 IMPLANT
HANDPIECE INTERPULSE COAX TIP (DISPOSABLE) ×2
IMMOBILIZER KNEE 20 (SOFTGOODS) ×2
IMMOBILIZER KNEE 20 THIGH 36 (SOFTGOODS) ×1 IMPLANT
KIT BASIN OR (CUSTOM PROCEDURE TRAY) ×2 IMPLANT
MANIFOLD NEPTUNE II (INSTRUMENTS) ×2 IMPLANT
NDL SAFETY ECLIPSE 18X1.5 (NEEDLE) ×2 IMPLANT
NEEDLE HYPO 18GX1.5 SHARP (NEEDLE) ×4
NS IRRIG 1000ML POUR BTL (IV SOLUTION) ×2 IMPLANT
PACK TOTAL JOINT (CUSTOM PROCEDURE TRAY) ×2 IMPLANT
PADDING CAST COTTON 6X4 STRL (CAST SUPPLIES) ×6 IMPLANT
POSITIONER SURGICAL ARM (MISCELLANEOUS) ×2 IMPLANT
SET HNDPC FAN SPRY TIP SCT (DISPOSABLE) ×1 IMPLANT
SPONGE GAUZE 4X4 12PLY (GAUZE/BANDAGES/DRESSINGS) ×2 IMPLANT
STRIP CLOSURE SKIN 1/2X4 (GAUZE/BANDAGES/DRESSINGS) ×4 IMPLANT
SUCTION FRAZIER 12FR DISP (SUCTIONS) ×2 IMPLANT
SUT MNCRL AB 4-0 PS2 18 (SUTURE) ×2 IMPLANT
SUT VIC AB 2-0 CT1 27 (SUTURE) ×6
SUT VIC AB 2-0 CT1 TAPERPNT 27 (SUTURE) ×3 IMPLANT
SUT VLOC 180 0 24IN GS25 (SUTURE) ×1 IMPLANT
SYR 20CC LL (SYRINGE) ×2 IMPLANT
SYR 50ML LL SCALE MARK (SYRINGE) ×2 IMPLANT
TOWEL OR 17X26 10 PK STRL BLUE (TOWEL DISPOSABLE) ×3 IMPLANT
TRAY FOLEY CATH 14FRSI W/METER (CATHETERS) ×2 IMPLANT
WATER STERILE IRR 1500ML POUR (IV SOLUTION) ×3 IMPLANT
WRAP KNEE MAXI GEL POST OP (GAUZE/BANDAGES/DRESSINGS) ×2 IMPLANT

## 2013-05-13 NOTE — Transfer of Care (Signed)
Immediate Anesthesia Transfer of Care Note  Patient: Antonio Daniel  Procedure(s) Performed: Procedure(s): RIGHT TOTAL KNEE ARTHROPLASTY (Right)  Patient Location: PACU  Anesthesia Type:Spinal  Level of Consciousness: sedated  Airway & Oxygen Therapy: Patient Spontanous Breathing and Patient connected to face mask oxygen  Post-op Assessment: Report given to PACU RN and Post -op Vital signs reviewed and stable  Post vital signs: Reviewed and stable  Complications: No apparent anesthesia complications

## 2013-05-13 NOTE — Plan of Care (Signed)
Problem: Consults Goal: Diagnosis- Total Joint Replacement Outcome: Completed/Met Date Met:  05/13/13 Right total knee replacement

## 2013-05-13 NOTE — Interval H&P Note (Signed)
History and Physical Interval Note:  05/13/2013 8:38 AM  Rosalyn Gess  has presented today for surgery, with the diagnosis of Osteoarthritis of the Right Knee  The various methods of treatment have been discussed with the patient and family. After consideration of risks, benefits and other options for treatment, the patient has consented to  Procedure(s): RIGHT TOTAL KNEE ARTHROPLASTY (Right) as a surgical intervention .  The patient's history has been reviewed, patient examined, no change in status, stable for surgery.  I have reviewed the patient's chart and labs.  Questions were answered to the patient's satisfaction.     Loanne Drilling

## 2013-05-13 NOTE — Progress Notes (Signed)
Utilization review completed.  

## 2013-05-13 NOTE — Anesthesia Preprocedure Evaluation (Addendum)
Anesthesia Evaluation  Patient identified by MRN, date of birth, ID band Patient awake    Reviewed: Allergy & Precautions, H&P , NPO status , Patient's Chart, lab work & pertinent test results  Airway Mallampati: II TM Distance: >3 FB Neck ROM: Full    Dental no notable dental hx.    Pulmonary neg pulmonary ROS,  breath sounds clear to auscultation  Pulmonary exam normal       Cardiovascular negative cardio ROS  Rhythm:Regular Rate:Normal     Neuro/Psych negative neurological ROS  negative psych ROS   GI/Hepatic negative GI ROS, Neg liver ROS,   Endo/Other  negative endocrine ROS  Renal/GU negative Renal ROS  negative genitourinary   Musculoskeletal negative musculoskeletal ROS (+)   Abdominal   Peds negative pediatric ROS (+)  Hematology negative hematology ROS (+)   Anesthesia Other Findings   Reproductive/Obstetrics negative OB ROS                           Anesthesia Physical Anesthesia Plan  ASA: II  Anesthesia Plan: Spinal   Post-op Pain Management:    Induction:   Airway Management Planned: Simple Face Mask  Additional Equipment:   Intra-op Plan:   Post-operative Plan:   Informed Consent: I have reviewed the patients History and Physical, chart, labs and discussed the procedure including the risks, benefits and alternatives for the proposed anesthesia with the patient or authorized representative who has indicated his/her understanding and acceptance.   Dental advisory given  Plan Discussed with: CRNA  Anesthesia Plan Comments:         Anesthesia Quick Evaluation  

## 2013-05-13 NOTE — Anesthesia Postprocedure Evaluation (Signed)
  Anesthesia Post-op Note  Patient: Antonio Daniel  Procedure(s) Performed: Procedure(s) (LRB): RIGHT TOTAL KNEE ARTHROPLASTY (Right)  Patient Location: PACU  Anesthesia Type: Spinal  Level of Consciousness: awake and alert   Airway and Oxygen Therapy: Patient Spontanous Breathing  Post-op Pain: mild  Post-op Assessment: Post-op Vital signs reviewed, Patient's Cardiovascular Status Stable, Respiratory Function Stable, Patent Airway and No signs of Nausea or vomiting  Last Vitals:  Filed Vitals:   05/13/13 1320  BP: 144/76  Pulse: 51  Temp: 36.4 C  Resp:     Post-op Vital Signs: stable   Complications: No apparent anesthesia complications

## 2013-05-13 NOTE — H&P (View-Only) (Signed)
Antonio Daniel  DOB: 05/23/1951 Married / Language: English / Race: Refused to Report/Unreported Male  Date of Admission:  05/13/2013  Chief Complaint:  Right Knee Pain  History of Present Illness The patient is a 62 year old male who comes in today for a preoperative History and Physical. The patient is scheduled for a right total knee arthroplasty to be performed by Dr. Frank V. Aluisio, MD at [facility] on 05/13/2013. The patient is a 62 year old male who presents for follow up of their knee. The patient is being followed for their right knee pain and osteoarthritis. Symptoms reported today include: pain, swelling (slight), pain in calf, aching, popping and grinding. The patient feels that they are doing poorly and report their pain level to be severe. Current treatment includes: NSAIDs and pain medications. The following medication has been used for pain control: antiinflammatory medication and Hydrocodone (needs refill). The patient has not gotten any relief of their symptoms with Cortisone injections (injection previously by Dr Aluisio did help). The patient indicates that he is ready to proceed with surgery. The knee is getting progressively worse to the point where he can not tolerate it anymore. It is limiting what he can and can not do. It is getting harder to do his work. The cortisone injection when it did work helped for only a couple of weeks. The last cortisone injection did not help at all. He is at a stage now where he feels like he needs to get this fixed. He wishes to proceed with knee replacement. They have been treated conservatively in the past for the above stated problem and despite conservative measures, they continue to have progressive pain and severe functional limitations and dysfunction. They have failed non-operative management including home exercise, medications, and injections. It is felt that they would benefit from undergoing total joint  replacement. Risks and benefits of the procedure have been discussed with the patient and they elect to proceed with surgery. There are no active contraindications to surgery such as ongoing infection or rapidly progressive neurological disease.   Problem List Primary osteoarthritis of one knee, right (715.16)    Allergies No Known Drug Allergies    Family History Osteoarthritis. sister Father. Deceased. age 62, Pancreatic Cancer Mother. Deceased. age 68, Leukemia    Social History Exercise. Exercises rarely; does other Illicit drug use. no Children. 2 Living situation. live with spouse Current work status. working full time Number of flights of stairs before winded. 4-5 Most recent primary occupation. welder Marital status. married Previously in rehab. no Drug/Alcohol Rehab (Currently). no Tobacco use. never smoker Tobacco / smoke exposure. no Post-Surgical Plans. Plan is to go home.    Medication History Hydrocodone Active. Ibuprofen Active.    Past Surgical History Spinal Surgery Arthroscopic Knee Surgery - Right    Medical History Diverticulitis Of Colon Gastroesophageal Reflux Disease    Review of Systems General:Not Present- Chills, Fever, Night Sweats, Fatigue, Weight Gain, Weight Loss and Memory Loss. Skin:Not Present- Hives, Itching, Rash, Eczema and Lesions. HEENT:Not Present- Tinnitus, Headache, Double Vision, Visual Loss, Hearing Loss and Dentures. Respiratory:Not Present- Shortness of breath with exertion, Shortness of breath at rest, Allergies, Coughing up blood and Chronic Cough. Cardiovascular:Not Present- Chest Pain, Racing/skipping heartbeats, Difficulty Breathing Lying Down, Murmur, Swelling and Palpitations. Gastrointestinal:Not Present- Bloody Stool, Heartburn, Abdominal Pain, Vomiting, Nausea, Constipation, Diarrhea, Difficulty Swallowing, Jaundice and Loss of appetitie. Male Genitourinary:Not  Present- Urinary frequency, Blood in Urine, Weak urinary stream, Discharge, Flank Pain, Incontinence,   Painful Urination, Urgency, Urinary Retention and Urinating at Night. Musculoskeletal:Present- Muscle Weakness, Joint Swelling, Joint Pain, Morning Stiffness and Spasms. Not Present- Muscle Pain and Back Pain. Neurological:Not Present- Tremor, Dizziness, Blackout spells, Paralysis, Difficulty with balance and Weakness. Psychiatric:Not Present- Insomnia.    Vitals Weight: 175 lb Height: 67 in Body Surface Area: 1.94 m Body Mass Index: 27.41 kg/m Pulse: 64 (Regular) Resp.: 12 (Unlabored) BP: 110/64 (Sitting, Right Arm, Standard)     Physical Exam The physical exam findings are as follows:   General Mental Status - Alert, cooperative and good historian. General Appearance- pleasant. Not in acute distress. Orientation- Oriented X3. Build & Nutrition- Well nourished and Well developed.   Head and Neck Head- normocephalic, atraumatic . Neck Global Assessment- supple. no bruit auscultated on the right and no bruit auscultated on the left.   Eye Vision- Wears corrective lenses. Pupil- Bilateral- Regular and Round. Motion- Bilateral- EOMI.   Chest and Lung Exam Auscultation: Breath sounds:- clear at anterior chest wall and - clear at posterior chest wall. Adventitious sounds:- No Adventitious sounds.   Cardiovascular Auscultation:Rhythm- Regular rate and rhythm. Heart Sounds- S1 WNL and S2 WNL. Murmurs & Other Heart Sounds:Auscultation of the heart reveals - No Murmurs.   Abdomen Palpation/Percussion:Tenderness- Abdomen is non-tender to palpation. Rigidity (guarding)- Abdomen is soft. Auscultation:Auscultation of the abdomen reveals - Bowel sounds normal.   Male Genitourinary  Not done, not pertinent to present illness  Musculoskeletal On exam he is alert and oriented in no apparent distress. His hips shows normal range  of motion, no discomfort. Right knee no effusion. Slight varus deformity. Range 5 to 120. Marked crepitus on range of motion. Tenderness medial greater than lateral with no instability. Left knee exam is unremarkable.  RADIOGRAPHS: x-rays from last December and he was already bone on bone medial and patellofemoral.   Assessment & Plan Primary osteoarthritis of one knee, right (715.16)  Note: Plan is for a Right Total Knee Replacement by Dr. Aluisio.  Plan is to go home.  PCP - [facility] - Patient has been seen preoperatively and felt to be stable for surgery. Ben Mann, PA-C  The patient does not have any contraindications and will receive TXA (tranexamic acid) prior to surgery.  Signed electronically by Alexzandrew L Perkins, III PA-C  

## 2013-05-13 NOTE — Op Note (Signed)
Pre-operative diagnosis- Osteoarthritis  Right knee(s)  Post-operative diagnosis- Osteoarthritis Right knee(s)  Procedure-  Right  Total Knee Arthroplasty  Surgeon- Antonio Rankin. Madysyn Hanken, MD  Assistant- Avel Peace, PA-C   Anesthesia-  Spinal EBL-* No blood loss amount entered *  Drains Hemovac  Tourniquet time-  Total Tourniquet Time Documented: Thigh (Left) - 31 minutes Total: Thigh (Left) - 31 minutes    Complications- None  Condition-PACU - hemodynamically stable.   Brief Clinical Note   Antonio Daniel is a 62 y.o. year old male with end stage OA of his right knee with progressively worsening pain and dysfunction. He has constant pain, with activity and at rest and significant functional deficits with difficulties even with ADLs. He has had extensive non-op management including analgesics, injections of cortisone, and home exercise program, but remains in significant pain with significant dysfunction. Radiographs show bone on bone arthritis medial and patellofemoral. He presents now for rightt Total Knee Arthroplasty.      Procedure in detail---   The patient is brought into the operating room and positioned supine on the operating table. After successful administration of  Spinal,   a tourniquet is placed high on the  Right thigh(s) and the lower extremity is prepped and draped in the usual sterile fashion. Time out is performed by the operating team and then the  Right lower extremity is wrapped in Esmarch, knee flexed and the tourniquet inflated to 300 mmHg.       A midline incision is made with a ten blade through the subcutaneous tissue to the level of the extensor mechanism. A fresh blade is used to make a medial parapatellar arthrotomy. Soft tissue over the proximal medial tibia is subperiosteally elevated to the joint line with a knife and into the semimembranosus bursa with a Cobb elevator. Soft tissue over the proximal lateral tibia is elevated with attention being paid to  avoiding the patellar tendon on the tibial tubercle. The patella is everted, knee flexed 90 degrees and the ACL and PCL are removed. Findings are bone on bone medial and patellofemoral with large medial osteophytes.        The drill is used to create a starting hole in the distal femur and the canal is thoroughly irrigated with sterile saline to remove the fatty contents. The 5 degree Right  valgus alignment guide is placed into the femoral canal and the distal femoral cutting block is pinned to remove 10 mm off the distal femur. Resection is made with an oscillating saw.      The tibia is subluxed forward and the menisci are removed. The extramedullary alignment guide is placed referencing proximally at the medial aspect of the tibial tubercle and distally along the second metatarsal axis and tibial crest. The block is pinned to remove 2mm off the more deficient medial  side. Resection is made with an oscillating saw. Size 4is the most appropriate size for the tibia and the proximal tibia is prepared with the modular drill and keel punch for that size.      The femoral sizing guide is placed and size 4 is most appropriate. Rotation is marked off the epicondylar axis and confirmed by creating a rectangular flexion gap at 90 degrees. The size 4 cutting block is pinned in this rotation and the anterior, posterior and chamfer cuts are made with the oscillating saw. The intercondylar block is then placed and that cut is made.      Trial size 4 tibial component, trial size  4 posterior stabilized femur and a 12.5  mm posterior stabilized rotating platform insert trial is placed. Full extension is achieved with excellent varus/valgus and anterior/posterior balance throughout full range of motion. The patella is everted and thickness measured to be 24  mm. Free hand resection is taken to 14 mm, a 38 template is placed, lug holes are drilled, trial patella is placed, and it tracks normally. Osteophytes are removed off  the posterior femur with the trial in place. All trials are removed and the cut bone surfaces prepared with pulsatile lavage. Cement is mixed and once ready for implantation, the size 4 tibial implant, size  4 posterior stabilized femoral component, and the size 38 patella are cemented in place and the patella is held with the clamp. The trial insert is placed and the knee held in full extension. The Exparel (20 ml mixed with 30 ml saline) and .25% Bupivicaine, are injected into the extensor mechanism, posterior capsule, medial and lateral gutters and subcutaneous tissues.  All extruded cement is removed and once the cement is hard the permanent 12.5 mm posterior stabilized rotating platform insert is placed into the tibial tray.      The wound is copiously irrigated with saline solution and the extensor mechanism closed over a hemovac drain with #1 PDS suture. The tourniquet is released for a total tourniquet time of 31  minutes. Flexion against gravity is 140 degrees and the patella tracks normally. Subcutaneous tissue is closed with 2.0 vicryl and subcuticular with running 4.0 Monocryl. The incision is cleaned and dried and steri-strips and a bulky sterile dressing are applied. The limb is placed into a knee immobilizer and the patient is awakened and transported to recovery in stable condition.      Please note that a surgical assistant was a medical necessity for this procedure in order to perform it in a safe and expeditious manner. Surgical assistant was necessary to retract the ligaments and vital neurovascular structures to prevent injury to them and also necessary for proper positioning of the limb to allow for anatomic placement of the prosthesis.   Antonio Rankin Maylie Ashton, MD    05/13/2013, 11:44 AM

## 2013-05-13 NOTE — Anesthesia Procedure Notes (Signed)
Spinal  Patient location during procedure: OR Start time: 05/13/2013 10:48 AM Staffing Anesthesiologist: Phillips Grout CRNA/Resident: [place], Sim Choquette C Performed by: resident/CRNA  Preanesthetic Checklist Completed: patient identified, site marked, surgical consent, pre-op evaluation, timeout performed, IV checked, risks and benefits discussed and monitors and equipment checked Spinal Block Patient position: sitting Prep: Betadine Patient monitoring: heart rate, cardiac monitor, continuous pulse ox and blood pressure Approach: midline Location: L3-4 Injection technique: single-shot Needle Needle type: Sprotte  Needle gauge: 24 G Assessment Sensory level: T6

## 2013-05-14 LAB — CBC
MCV: 88.3 fL (ref 78.0–100.0)
Platelets: 213 10*3/uL (ref 150–400)
RBC: 3.94 MIL/uL — ABNORMAL LOW (ref 4.22–5.81)
RDW: 13.4 % (ref 11.5–15.5)
WBC: 14.7 10*3/uL — ABNORMAL HIGH (ref 4.0–10.5)

## 2013-05-14 LAB — BASIC METABOLIC PANEL
CO2: 25 mEq/L (ref 19–32)
Calcium: 8.7 mg/dL (ref 8.4–10.5)
Chloride: 101 mEq/L (ref 96–112)
Creatinine, Ser: 0.73 mg/dL (ref 0.50–1.35)
GFR calc Af Amer: >90 mL/min (ref >90)
Glucose, Bld: 124 mg/dL — ABNORMAL HIGH (ref 70–99)
Sodium: 135 mEq/L (ref 135–145)

## 2013-05-14 MED ORDER — HYDROMORPHONE HCL 2 MG PO TABS: 2.0000 mg | ORAL_TABLET | ORAL | Status: DC | PRN

## 2013-05-14 NOTE — Progress Notes (Signed)
   Subjective: 1 Day Post-Op Procedure(s) (LRB): RIGHT TOTAL KNEE ARTHROPLASTY (Right) Patient reports pain as mild.   Patient seen in rounds with Dr. Lequita Halt.  Wife in room at bedside. Patient is well, and has had no acute complaints or problems We will start therapy today. Will DC oxy and change to Dilaudid tabs Plan is to go Home after hospital stay.  Objective: Vital signs in last 24 hours: Temp:  [97.3 F (36.3 C)-98.4 F (36.9 C)] 98.2 F (36.8 C) (10/28 0549) Pulse Rate:  [49-76] 67 (10/28 0549) Resp:  [10-18] 14 (10/28 0549) BP: (96-144)/(56-83) 110/56 mmHg (10/28 0549) SpO2:  [95 %-100 %] 96 % (10/28 0549) Weight:  [78.019 kg (172 lb)] 78.019 kg (172 lb) (10/27 1446)  Intake/Output from previous day:  Intake/Output Summary (Last 24 hours) at 05/14/13 0746 Last data filed at 05/14/13 0549  Gross per 24 hour  Intake   2940 ml  Output   1100 ml  Net   1840 ml    Intake/Output this shift:    Labs:  Recent Labs  05/14/13 0445  HGB 11.8*    Recent Labs  05/14/13 0445  WBC 14.7*  RBC 3.94*  HCT 34.8*  PLT 213    Recent Labs  05/14/13 0445  NA 135  K 4.2  CL 101  CO2 25  BUN 16  CREATININE 0.73  GLUCOSE 124*  CALCIUM 8.7   No results found for this basename: LABPT, INR,  in the last 72 hours  EXAM General - Patient is Alert, Appropriate and Oriented Extremity - Neurovascular intact Sensation intact distally Dorsiflexion/Plantar flexion intact Dressing - dressing C/D/I Motor Function - intact, moving foot and toes well on exam.  Hemovac pulled without difficulty.  Past Medical History  Diagnosis Date  . Arthritis     oa rt knee and arthritis all over    Assessment/Plan: 1 Day Post-Op Procedure(s) (LRB): RIGHT TOTAL KNEE ARTHROPLASTY (Right) Principal Problem:   OA (osteoarthritis) of knee  Estimated body mass index is 24.68 kg/(m^2) as calculated from the following:   Height as of this encounter: 5\' 10"  (1.778 m).   Weight as of  this encounter: 78.019 kg (172 lb). Advance diet Up with therapy Plan for discharge tomorrow Discharge home with home health  DVT Prophylaxis - Xarelto Weight-Bearing as tolerated to right leg D/C O2 and Pulse OX and try on Room Air  Antonio Daniel 05/14/2013, 7:46 AM

## 2013-05-14 NOTE — Progress Notes (Signed)
05/14/13 1300  PT Visit Information  Last PT Received On 05/14/13  Assistance Needed +1  History of Present Illness s/p R TKA  PT Time Calculation  PT Start Time 1255  PT Stop Time 1316  PT Time Calculation (min) 21 min  Subjective Data  Patient Stated Goal home  Precautions  Precautions Knee  Required Braces or Orthoses Knee Immobilizer - Right  Knee Immobilizer - Right Discontinue once straight leg raise with < 10 degree lag  Restrictions  Other Position/Activity Restrictions WBAT  Cognition  Arousal/Alertness Awake/alert  Behavior During Therapy WFL for tasks assessed/performed  Overall Cognitive Status Within Functional Limits for tasks assessed  Bed Mobility  Bed Mobility Sit to Supine  Sit to Supine 4: Min assist  Details for Bed Mobility Assistance verbal cues for self assist and min/guard for RLE  Transfers  Transfers Sit to Stand;Stand to Sit  Sit to Stand 4: Min guard  Stand to Sit 4: Min guard  Details for Transfer Assistance verbal cues for hand placement and LE position  Ambulation/Gait  Ambulation/Gait Assistance 4: Min guard  Ambulation Distance (Feet) 100 Feet  Assistive device Rolling walker  Ambulation/Gait Assistance Details cues for RW safety  Gait Pattern Step-to pattern;Antalgic  Gait velocity decr  Total Joint Exercises  Ankle Circles/Pumps AROM;10 reps  Quad Sets AROM;10 reps;Right  Heel Slides 10 reps;Right;AAROM  Straight Leg Raises AAROM;Right;10 reps  PT - End of Session  Activity Tolerance Patient tolerated treatment well  Patient left with call bell/phone within reach;in bed;with family/visitor present  Nurse Communication Mobility status  PT - Assessment/Plan  PT Plan Current plan remains appropriate  PT Frequency 7X/week  Follow Up Recommendations Home health PT  PT equipment None recommended by PT  PT Goal Progression  Progress towards PT goals Progressing toward goals  Acute Rehab PT Goals  Time For Goal Achievement 05/17/13   Potential to Achieve Goals Good  PT General Charges  $$ ACUTE PT VISIT 1 Procedure  PT Treatments  $Gait Training 8-22 mins

## 2013-05-14 NOTE — Evaluation (Signed)
Physical Therapy Evaluation Patient Details Name: Antonio Daniel MRN: [id number] DOB: [birth year] Today's Date: 05/14/2013 Time: 0865-7846 PT Time Calculation (min): 27 min  PT Assessment / Plan / Recommendation History of Present Illness  s/p R TKA  Clinical Impression  Pt admitted with R TKA. Pt currently with functional limitations due to the deficits listed below (see PT Problem List).  Pt will benefit from skilled PT to increase their independence and safety with mobility to allow discharge to the venue listed below.      PT Assessment  Patient needs continued PT services    Follow Up Recommendations  Home health PT    Does the patient have the potential to tolerate intense rehabilitation      Barriers to Discharge        Equipment Recommendations  None recommended by PT    Recommendations for Other Services     Frequency 7X/week    Precautions / Restrictions Precautions Precautions: Knee Required Braces or Orthoses: Knee Immobilizer - Right Restrictions Weight Bearing Restrictions: No Other Position/Activity Restrictions: WBAT   Pertinent Vitals/Pain Pain is "ok"      Mobility  Bed Mobility Bed Mobility: Supine to Sit Supine to Sit: 4: Min assist Details for Bed Mobility Assistance: verbal cues for self assist and min/guard for RLE Transfers Transfers: Sit to Stand;Stand to Sit Sit to Stand: 4: Min guard Stand to Sit: 4: Min guard Details for Transfer Assistance: verbal cues for hand placement and LE position Ambulation/Gait Ambulation/Gait Assistance: 4: Min guard Ambulation Distance (Feet): 100 Feet Assistive device: Rolling walker Ambulation/Gait Assistance Details: verbal cues for RW safety and distance from self, especially with turns Gait Pattern: Step-to pattern;Antalgic    Exercises Total Joint Exercises Ankle Circles/Pumps: AROM;10 reps Quad Sets: AROM;10 reps;Right   PT Diagnosis: Difficulty walking  PT Problem List: Decreased  strength;Decreased range of motion;Decreased activity tolerance;Decreased balance;Decreased mobility;Decreased knowledge of use of DME PT Treatment Interventions: DME instruction;Gait training;Functional mobility training;Therapeutic activities;Therapeutic exercise;Patient/family education     PT Goals(Current goals can be found in the care plan section) Acute Rehab PT Goals Patient Stated Goal: home PT Goal Formulation: With patient Time For Goal Achievement: 05/17/13 Potential to Achieve Goals: Good  Visit Information  Last PT Received On: 05/14/13 Assistance Needed: +1 History of Present Illness: s/p R TKA       Prior Functioning  Home Living Family/patient expects to be discharged to:: Private residence Living Arrangements: Spouse/significant other Type of Home: House Home Access: Stairs to enter Secretary/administrator of Steps: 1 Home Layout: One level Home Equipment: Crutches;Wheelchair - manual Prior Function Level of Independence: Independent Communication Communication: No difficulties    Cognition  Cognition Arousal/Alertness: Awake/alert Behavior During Therapy: WFL for tasks assessed/performed Overall Cognitive Status: Within Functional Limits for tasks assessed    Extremity/Trunk Assessment Lower Extremity Assessment Lower Extremity Assessment: RLE deficits/detail   Balance    End of Session PT - End of Session Equipment Utilized During Treatment: Gait belt Activity Tolerance: Patient tolerated treatment well Nurse Communication: Mobility status  GP     [facility] 05/14/2013, 10:28 AM

## 2013-05-14 NOTE — Care Management Note (Addendum)
    Page 1 of 2   05/15/2013     2:13:46 PM   CARE MANAGEMENT NOTE 05/15/2013  Patient:  Antonio Daniel, Antonio Daniel   Account Number:  [id number]  Date Initiated:  05/14/2013  Documentation initiated by:  Colleen Can  Subjective/Objective Assessment:   dx total rt knee replacemnt     Action/Plan:   CM spoke with patient. APlans are for patient to return to his home in [place] where spouse will be caregiver. He already has acces to DME- rw and 3n[employer]. Wants HHagency in network.   Anticipated DC Date:  05/15/2013   Anticipated DC Plan:  HOME W HOME HEALTH SERVICES      DC Planning Services  CM consult      [facility] Choice  HOME HEALTH   Choice offered to / List presented to:  C-1 Patient        HH arranged  HH-2 PT      [facility] agency  [facility]   Status of service:  Completed, signed off Medicare Important Message given?   (If response is "NO", the following Medicare IM given date fields will be blank) Date Medicare IM given:   Date Additional Medicare IM given:    Discharge Disposition:  HOME W HOME HEALTH SERVICES  Per UR Regulation:    If discussed at Long Length of Stay Meetings, dates discussed:    Comments:  05/15/2013 Colleen Can BSN RN CCM [phone] Pt discharged today with [facility] to provide services. [facility] notified of discharge. Services will start within 48hrs.  05/14/2013 Colleen Can BSN RN CCM [phone] T[facility] intake; spoke with Sarah in intake who advised that they would be able to provide HHpt services for patient. Faxed HH orders, pt notes, op note, h& p, face sheet and face sheet to [phone]. Confirmation received.

## 2013-05-14 NOTE — Progress Notes (Signed)
OT Cancellation Note  Patient Details Name: Antonio Daniel MRN: [id number] DOB: 09-Sep-1950   Cancelled Treatment:    Reason Eval/Treat Not Completed: OT screened, no needs identified, will sign off.  Pt has standard commode with grab bar installed; does not feel he needs DME.  He will sponge bathe until he can get into tub and wife will assist prn with adls.   Yue Flanigan 05/14/2013, 12:02 PM Marica Otter, OTR/L [phone] 05/14/2013

## 2013-05-15 LAB — CBC
HCT: 32.4 % — ABNORMAL LOW (ref 39.0–52.0)
MCH: 30.1 pg (ref 26.0–34.0)
MCHC: 34 g/dL (ref 30.0–36.0)
Platelets: 220 10*3/uL (ref 150–400)
RDW: 13.6 % (ref 11.5–15.5)
WBC: 16.1 10*3/uL — ABNORMAL HIGH (ref 4.0–10.5)

## 2013-05-15 LAB — BASIC METABOLIC PANEL
BUN: 14 mg/dL (ref 6–23)
Calcium: 9.1 mg/dL (ref 8.4–10.5)
Chloride: 103 mEq/L (ref 96–112)
Creatinine, Ser: 0.79 mg/dL (ref 0.50–1.35)
GFR calc Af Amer: >90 mL/min (ref >90)
GFR calc non Af Amer: >90 mL/min (ref >90)
Potassium: 3.7 mEq/L (ref 3.5–5.1)

## 2013-05-15 MED ORDER — TRAMADOL HCL 50 MG PO TABS: 50.0000 mg | ORAL_TABLET | Freq: Four times a day (QID) | ORAL | Status: DC | PRN

## 2013-05-15 MED ORDER — HYDROCODONE-ACETAMINOPHEN 5-325 MG PO TABS: 1.0000 | ORAL_TABLET | Freq: Four times a day (QID) | ORAL | Status: DC | PRN

## 2013-05-15 MED ORDER — RIVAROXABAN 10 MG PO TABS: 10.0000 mg | ORAL_TABLET | Freq: Every day | ORAL | Status: DC

## 2013-05-15 MED ORDER — METHOCARBAMOL 500 MG PO TABS: 500.0000 mg | ORAL_TABLET | Freq: Four times a day (QID) | ORAL | Status: DC | PRN

## 2013-05-15 NOTE — Progress Notes (Signed)
   Subjective: 2 Days Post-Op Procedure(s) (LRB): RIGHT TOTAL KNEE ARTHROPLASTY (Right) Patient reports pain as mild.   Patient seen in rounds with Dr. Lequita Halt. Patient is well, and has had no acute complaints or problems Patient is ready to go home  Objective: Vital signs in last 24 hours: Temp:  [98.5 F (36.9 C)-98.7 F (37.1 C)] 98.5 F (36.9 C) (10/29 0514) Pulse Rate:  [69-74] 69 (10/29 0514) Resp:  [14-16] 14 (10/29 0514) BP: (121-125)/(66-76) 125/76 mmHg (10/29 0514) SpO2:  [95 %-98 %] 98 % (10/29 0514)  Intake/Output from previous day:  Intake/Output Summary (Last 24 hours) at 05/15/13 1040 Last data filed at 05/15/13 0700  Gross per 24 hour  Intake    480 ml  Output      0 ml  Net    480 ml    Intake/Output this shift:    Labs:  Recent Labs  05/14/13 0445 05/15/13 0520  HGB 11.8* 11.0*    Recent Labs  05/14/13 0445 05/15/13 0520  WBC 14.7* 16.1*  RBC 3.94* 3.65*  HCT 34.8* 32.4*  PLT 213 220    Recent Labs  05/14/13 0445 05/15/13 0520  NA 135 136  K 4.2 3.7  CL 101 103  CO2 25 26  BUN 16 14  CREATININE 0.73 0.79  GLUCOSE 124* 115*  CALCIUM 8.7 9.1   No results found for this basename: LABPT, INR,  in the last 72 hours  EXAM: General - Patient is Alert, Appropriate and Oriented Extremity - Neurovascular intact Sensation intact distally Dorsiflexion/Plantar flexion intact No cellulitis present Incision - clean, dry, no drainage, healing Motor Function - intact, moving foot and toes well on exam.   Assessment/Plan: 2 Days Post-Op Procedure(s) (LRB): RIGHT TOTAL KNEE ARTHROPLASTY (Right) Procedure(s) (LRB): RIGHT TOTAL KNEE ARTHROPLASTY (Right) Past Medical History  Diagnosis Date  . Arthritis     oa rt knee and arthritis all over   Principal Problem:   OA (osteoarthritis) of knee  Estimated body mass index is 24.68 kg/(m^2) as calculated from the following:   Height as of this encounter: 5\' 10"  (1.778 m).   Weight as of  this encounter: 78.019 kg (172 lb). Up with therapy Discharge home with home health Diet - Regular diet Follow up - in 2 weeks Activity - WBAT Disposition - Home Condition Upon Discharge - Good D/C Meds - See DC Summary DVT Prophylaxis - Xarelto  Calina Patrie 05/15/2013, 10:40 AM

## 2013-05-15 NOTE — Progress Notes (Signed)
Physical Therapy Treatment Patient Details Name: Antonio Daniel MRN: [id number] DOB: 1950-11-24 Today's Date: 05/15/2013 Time: 1000-1033 PT Time Calculation (min): 33 min  PT Assessment / Plan / Recommendation  History of Present Illness s/p R TKA   PT Comments     Follow Up Recommendations  Home health PT     Does the patient have the potential to tolerate intense rehabilitation     Barriers to Discharge        Equipment Recommendations  None recommended by PT    Recommendations for Other Services    Frequency 7X/week   Progress towards PT Goals Progress towards PT goals: Progressing toward goals  Plan Current plan remains appropriate    Precautions / Restrictions Precautions Precautions: Knee Precaution Comments: I SLR Restrictions Other Position/Activity Restrictions: WBAT   Pertinent Vitals/Pain     Mobility  Bed Mobility Bed Mobility: Supine to Sit;Sit to Supine Supine to Sit: 5: Supervision Sit to Supine: 5: Supervision Details for Bed Mobility Assistance: pt self asssits RLE with LLE Transfers Transfers: Sit to Stand;Stand to Sit Sit to Stand: 5: Supervision;6: Modified independent (Device/Increase time) Stand to Sit: 5: Supervision;6: Modified independent (Device/Increase time) Details for Transfer Assistance: verbal cues for hand placement and LE position Ambulation/Gait Ambulation/Gait Assistance: 5: Supervision Ambulation Distance (Feet): 180 Feet Assistive device: Rolling walker Gait Pattern: Step-to pattern;Antalgic Stairs: Yes Stairs Assistance: 4: Min guard Stair Management Technique: Backwards;With walker Number of Stairs: 1    Exercises Total Joint Exercises Ankle Circles/Pumps: AROM;10 reps Quad Sets: AROM;10 reps;Right Heel Slides: 10 reps;Right;AAROM Straight Leg Raises: AROM;Right;10 reps   PT Diagnosis:    PT Problem List:   PT Treatment Interventions:     PT Goals (current goals can now be found in the care plan  section) Acute Rehab PT Goals Patient Stated Goal: home Time For Goal Achievement: 05/17/13 Potential to Achieve Goals: Good  Visit Information  Last PT Received On: 05/15/13 Assistance Needed: +1 History of Present Illness: s/p R TKA    Subjective Data  Patient Stated Goal: home   Cognition  Cognition Arousal/Alertness: Awake/alert Behavior During Therapy: WFL for tasks assessed/performed Overall Cognitive Status: Within Functional Limits for tasks assessed    Balance     End of Session PT - End of Session Equipment Utilized During Treatment: Gait belt Activity Tolerance: Patient tolerated treatment well Patient left: with call bell/phone within reach;in bed;with family/visitor present Nurse Communication: Mobility status   GP     [facility] 05/15/2013, 12:56 PM

## 2013-05-15 NOTE — Discharge Summary (Signed)
Physician Discharge Summary   Patient ID: GRIFF BADLEY MRN: [id number] DOB/AGE: Aug 01, 1950 62 y.o.  Admit date: 05/13/2013 Discharge date: 05/15/2013  Primary Diagnosis:  Osteoarthritis Right knee(s)  Admission Diagnoses:  Past Medical History  Diagnosis Date  . Arthritis     oa rt knee and arthritis all over   Discharge Diagnoses:   Principal Problem:   OA (osteoarthritis) of knee  Estimated body mass index is 24.68 kg/(m^2) as calculated from the following:   Height as of this encounter: 5\' 10"  (1.778 m).   Weight as of this encounter: 78.019 kg (172 lb).  Procedure:  Procedure(s) (LRB): RIGHT TOTAL KNEE ARTHROPLASTY (Right)   Consults: None  HPI: TAMARCUS CONDIE is a 62 y.o. year old male with end stage OA of his right knee with progressively worsening pain and dysfunction. He has constant pain, with activity and at rest and significant functional deficits with difficulties even with ADLs. He has had extensive non-op management including analgesics, injections of cortisone, and home exercise program, but remains in significant pain with significant dysfunction. Radiographs show bone on bone arthritis medial and patellofemoral. He presents now for rightt Total Knee Arthroplasty.   Laboratory Data: Admission on 05/13/2013, Discharged on 05/15/2013  Component Date Value Range Status  . WBC 05/14/2013 14.7* 4.0 - 10.5 K/uL Final  . RBC 05/14/2013 3.94* 4.22 - 5.81 MIL/uL Final  . Hemoglobin 05/14/2013 11.8* 13.0 - 17.0 g/dL Final  . HCT 14/78/2956 34.8* 39.0 - 52.0 % Final  . MCV 05/14/2013 88.3  78.0 - 100.0 fL Final  . MCH 05/14/2013 29.9  26.0 - 34.0 pg Final  . MCHC 05/14/2013 33.9  30.0 - 36.0 g/dL Final  . RDW 21/30/8657 13.4  11.5 - 15.5 % Final  . Platelets 05/14/2013 213  150 - 400 K/uL Final  . Sodium 05/14/2013 135  135 - 145 mEq/L Final  . Potassium 05/14/2013 4.2  3.5 - 5.1 mEq/L Final  . Chloride 05/14/2013 101  96 - 112 mEq/L Final  . CO2  05/14/2013 25  19 - 32 mEq/L Final  . Glucose, Bld 05/14/2013 124* 70 - 99 mg/dL Final  . BUN 84/69/6295 16  6 - 23 mg/dL Final  . Creatinine, Ser 05/14/2013 0.73  0.50 - 1.35 mg/dL Final  . Calcium 28/41/3244 8.7  8.4 - 10.5 mg/dL Final  . GFR calc non Af Amer 05/14/2013 >90  >90 mL/min Final  . GFR calc Af Amer 05/14/2013 >90  >90 mL/min Final   Comment: (NOTE)                          The eGFR has been calculated using the CKD EPI equation.                          This calculation has not been validated in all clinical situations.                          eGFR's persistently <90 mL/min signify possible Chronic Kidney                          Disease.  . WBC 05/15/2013 16.1* 4.0 - 10.5 K/uL Final  . RBC 05/15/2013 3.65* 4.22 - 5.81 MIL/uL Final  . Hemoglobin 05/15/2013 11.0* 13.0 - 17.0 g/dL Final  . HCT 07/20/7251 32.4* 39.0 - 52.0 % Final  .  MCV 05/15/2013 88.8  78.0 - 100.0 fL Final  . MCH 05/15/2013 30.1  26.0 - 34.0 pg Final  . MCHC 05/15/2013 34.0  30.0 - 36.0 g/dL Final  . RDW 16/04/9603 13.6  11.5 - 15.5 % Final  . Platelets 05/15/2013 220  150 - 400 K/uL Final  . Sodium 05/15/2013 136  135 - 145 mEq/L Final  . Potassium 05/15/2013 3.7  3.5 - 5.1 mEq/L Final  . Chloride 05/15/2013 103  96 - 112 mEq/L Final  . CO2 05/15/2013 26  19 - 32 mEq/L Final  . Glucose, Bld 05/15/2013 115* 70 - 99 mg/dL Final  . BUN 54/03/8118 14  6 - 23 mg/dL Final  . Creatinine, Ser 05/15/2013 0.79  0.50 - 1.35 mg/dL Final  . Calcium 14/78/2956 9.1  8.4 - 10.5 mg/dL Final  . GFR calc non Af Amer 05/15/2013 >90  >90 mL/min Final  . GFR calc Af Amer 05/15/2013 >90  >90 mL/min Final   Comment: (NOTE)                          The eGFR has been calculated using the CKD EPI equation.                          This calculation has not been validated in all clinical situations.                          eGFR's persistently <90 mL/min signify possible Chronic Kidney                          Disease.    Hospital Outpatient Visit on 05/08/2013  Component Date Value Range Status  . MRSA, PCR 05/08/2013 NEGATIVE  NEGATIVE Final  . Staphylococcus aureus 05/08/2013 POSITIVE* NEGATIVE Final   Comment:                                 The Xpert SA Assay (FDA                          approved for NASAL specimens                          in patients over 47 years of age),                          is one component of                          a comprehensive surveillance                          program.  Test performance has                          been validated by [employer] for patients greater  than or equal to 51 year old.                          It is not intended                          to diagnose infection nor to                          guide or monitor treatment.  Marland Kitchen aPTT 05/08/2013 34  24 - 37 seconds Final  . Prothrombin Time 05/08/2013 12.8  11.6 - 15.2 seconds Final  . INR 05/08/2013 0.98  0.00 - 1.49 Final  . ABO/RH(D) 05/08/2013 O POS   Final  . Antibody Screen 05/08/2013 NEG   Final  . Sample Expiration 05/08/2013 05/16/2013   Final  . ABO/RH(D) 05/08/2013 O POS   Final     X-Rays:No results found.  EKG:No orders found for this or any previous visit.   Hospital Course: RUHAAN NORDAHL is a 62 y.o. who was admitted to [facility]. They were brought to the operating room on 05/13/2013 and underwent Procedure(s): RIGHT TOTAL KNEE ARTHROPLASTY.  Patient tolerated the procedure well and was later transferred to the recovery room and then to the orthopaedic floor for postoperative care.  They were given PO and IV analgesics for pain control following their surgery.  They were given 24 hours of postoperative antibiotics of  Anti-infectives   Start     Dose/Rate Route Frequency Ordered Stop   05/13/13 1630  ceFAZolin (ANCEF) IVPB 2 g/50 mL premix     2 g 100 mL/hr over 30 Minutes Intravenous Every 6 hours 05/13/13  1334 05/13/13 2231   05/13/13 0745  ceFAZolin (ANCEF) IVPB 2 g/50 mL premix     2 g 100 mL/hr over 30 Minutes Intravenous On call to O.R. 05/13/13 1610 05/13/13 1052     and started on DVT prophylaxis in the form of Xarelto.   PT and OT were ordered for total joint protocol.  Discharge planning consulted to help with postop disposition and equipment needs.  Patient had a decent night on the evening of surgery.  They started to get up OOB with therapy on day one walking 100 feet. Hemovac drain was pulled without difficulty.  The oxycodone was changed to dilaudid.  Continued to work with therapy into day two.  Dressing was changed on day two and the incision was healing well.   Patient was seen in rounds and was ready to go home later that same day.   Discharge Medications: Prior to Admission medications   Medication Sig Start Date End Date Taking? Authorizing Provider  diphenhydrAMINE (BENADRYL) 25 mg capsule Take 25 mg by mouth every 6 (six) hours as needed for itching.    Historical Provider, MD  HYDROcodone-acetaminophen (NORCO/VICODIN) 5-325 MG per tablet Take 1 tablet by mouth every 6 (six) hours as needed for pain. 05/15/13   Alexzandrew Perkins, PA-C  methocarbamol (ROBAXIN) 500 MG tablet Take 1 tablet (500 mg total) by mouth every 6 (six) hours as needed. 05/15/13   Alexzandrew Julien Girt, PA-C  rivaroxaban (XARELTO) 10 MG TABS tablet Take 1 tablet (10 mg total) by mouth daily with breakfast. Take Xarelto for two and a half more weeks, then discontinue Xarelto. Once the patient has completed the blood thinner regimen, then take a Baby 81 mg Aspirin daily for four  more weeks. 05/15/13   Alexzandrew Perkins, PA-C  traMADol (ULTRAM) 50 MG tablet Take 1-2 tablets (50-100 mg total) by mouth every 6 (six) hours as needed (mild pain). 05/15/13   Alexzandrew Julien Girt, PA-C   Discharge home with home health  Diet - Regular diet  Follow up - in 2 weeks  Activity - WBAT  Disposition - Home  Condition  Upon Discharge - Good  D/C Meds - See DC Summary  DVT Prophylaxis - Xarelto       Discharge Orders   Future Orders Complete By Expires   Call MD / Call 911  As directed    Comments:     If you experience chest pain or shortness of breath, CALL 911 and be transported to the hospital emergency room.  If you develope a fever above 101 F, pus (white drainage) or increased drainage or redness at the wound, or calf pain, call your surgeon's office.   Change dressing  As directed    Comments:     Change dressing daily with sterile 4 x 4 inch gauze dressing and apply TED hose. Do not submerge the incision under water.   Constipation Prevention  As directed    Comments:     Drink plenty of fluids.  Prune juice may be helpful.  You may use a stool softener, such as Colace (over the counter) 100 mg twice a day.  Use MiraLax (over the counter) for constipation as needed.   Diet general  As directed    Discharge instructions  As directed    Comments:     Pick up stool softner and laxative for home. Do not submerge incision under water. May shower. Continue to use ice for pain and swelling from surgery.   Take Xarelto for two and a half more weeks, then discontinue Xarelto. Once the patient has completed the blood thinner regimen, then take a Baby 81 mg Aspirin daily for four more weeks.   Do not put a pillow under the knee. Place it under the heel.  As directed    Do not sit on low chairs, stoools or toilet seats, as it may be difficult to get up from low surfaces  As directed    Driving restrictions  As directed    Comments:     No driving until released by the physician.   Increase activity slowly as tolerated  As directed    Lifting restrictions  As directed    Comments:     No lifting until released by the physician.   Patient may shower  As directed    Comments:     You may shower without a dressing once there is no drainage.  Do not wash over the wound.  If drainage remains, do not  shower until drainage stops.   TED hose  As directed    Comments:     Use stockings (TED hose) for 3 weeks on both leg(s).  You may remove them at night for sleeping.   Weight bearing as tolerated  As directed    Questions:     Laterality:     Extremity:         Medication List    STOP taking these medications       ibuprofen 200 MG tablet  Commonly known as:  ADVIL,MOTRIN      TAKE these medications       diphenhydrAMINE 25 mg capsule  Commonly known as:  BENADRYL  Take 25 mg by  mouth every 6 (six) hours as needed for itching.     HYDROcodone-acetaminophen 5-325 MG per tablet  Commonly known as:  NORCO/VICODIN  Take 1 tablet by mouth every 6 (six) hours as needed for pain.     methocarbamol 500 MG tablet  Commonly known as:  ROBAXIN  Take 1 tablet (500 mg total) by mouth every 6 (six) hours as needed.     rivaroxaban 10 MG Tabs tablet  Commonly known as:  XARELTO  - Take 1 tablet (10 mg total) by mouth daily with breakfast. Take Xarelto for two and a half more weeks, then discontinue Xarelto.  - Once the patient has completed the blood thinner regimen, then take a Baby 81 mg Aspirin daily for four more weeks.     traMADol 50 MG tablet  Commonly known as:  ULTRAM  Take 1-2 tablets (50-100 mg total) by mouth every 6 (six) hours as needed (mild pain).       Follow-up Information   Follow up with Loanne Drilling, MD. Schedule an appointment as soon as possible for a visit in 2 weeks.   Specialty:  Orthopedic Surgery   Contact information:   [address] [place] [address] [phone]       Signed: Patrica Duel 05/23/2013, 9:43 AM

## 2013-06-04 ENCOUNTER — Ambulatory Visit (HOSPITAL_COMMUNITY)
Admission: RE | Admit: 2013-06-04 | Discharge: 2013-06-04 | Disposition: A | Discharge status: Home / Self Care | Payer: 59 | Source: Ambulatory Visit | Attending: Orthopedic Surgery | Admitting: Orthopedic Surgery

## 2013-06-04 DIAGNOSIS — R2689 Other abnormalities of gait and mobility: Secondary | ICD-10-CM | POA: Insufficient documentation

## 2013-06-04 DIAGNOSIS — IMO0001 Reserved for inherently not codable concepts without codable children: Secondary | ICD-10-CM | POA: Insufficient documentation

## 2013-06-04 DIAGNOSIS — M25569 Pain in unspecified knee: Secondary | ICD-10-CM | POA: Insufficient documentation

## 2013-06-04 DIAGNOSIS — R269 Unspecified abnormalities of gait and mobility: Secondary | ICD-10-CM | POA: Insufficient documentation

## 2013-06-04 DIAGNOSIS — M25669 Stiffness of unspecified knee, not elsewhere classified: Secondary | ICD-10-CM | POA: Insufficient documentation

## 2013-06-04 DIAGNOSIS — M25559 Pain in unspecified hip: Secondary | ICD-10-CM | POA: Insufficient documentation
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
## 2013-06-11 ENCOUNTER — Ambulatory Visit (HOSPITAL_COMMUNITY)
Admission: RE | Admit: 2013-06-11 | Discharge: 2013-06-11 | Disposition: A | Discharge status: Home / Self Care | Payer: 59 | Source: Ambulatory Visit | Attending: Physician Assistant | Admitting: Physician Assistant

## 2013-06-11 DIAGNOSIS — R2689 Other abnormalities of gait and mobility: Secondary | ICD-10-CM

## 2013-06-11 DIAGNOSIS — M25669 Stiffness of unspecified knee, not elsewhere classified: Secondary | ICD-10-CM

## 2013-06-11 NOTE — Progress Notes (Signed)
Physical Therapy Treatment Patient Details  Name: Antonio Daniel MRN: [id number] Date of Birth: [birth year]  Today's Date: 06/11/2013 Time: 1015-1040 PT Time Calculation (min): 25 min Charge there ex 1015-1040 Visit#: 2 of 4  Re-eval: 07/04/13    Authorization: [employer]   Subjective: Symptoms/Limitations Symptoms: Pt states that he walked in [place] for an hour which irritated his knee. Pain Assessment Currently in Pain?: No/denies   Exercise/Treatments Soleus Stretch:  (slant board 3x 30")   Standing Heel Raises: 10 reps Terminal Knee Extension: Strengthening;10 reps;Theraband Theraband Level (Terminal Knee Extension): Level 4 (Blue) Rocker Board: 2 minutes SLS with Vectors: 30" 3   Supine Quad Sets: 10 reps  Physical Therapy Assessment and Plan PT Assessment and Plan Clinical Impression Statement: Pt had no complaint of pain with any activities until completing quad sets ast which time pt stated " I've chopped and hauled wood and my knee hasn't hurt as much as it has with what we have done here"; Pt knee feels warm to touch therapist recommended icing pt but pt refused stating that he was going to go home.  Suggested retro massage to decrease pain and swelling again pt refused and left therapy stating he was not going to do anything else. PT Plan: contact pt next week regarding knee        Problem List Patient Active Problem List   Diagnosis Date Noted  . Stiffness of joint, not elsewhere classified, lower leg 06/04/2013  . Balance problem 06/04/2013  . OA (osteoarthritis) of knee 05/13/2013       GP    RUSSELL,CINDY 06/11/2013, 10:48 AM

## 2013-11-25 ENCOUNTER — Ambulatory Visit (INDEPENDENT_AMBULATORY_CARE_PROVIDER_SITE_OTHER): Payer: 59 | Admitting: Gastroenterology

## 2013-11-25 ENCOUNTER — Encounter: Payer: Self-pay | Admitting: Gastroenterology

## 2013-11-25 VITALS — BP 135/79 | HR 66 | Temp 98.4°F | Ht 70.0 in | Wt 174.8 lb

## 2013-11-25 DIAGNOSIS — R1310 Dysphagia, unspecified: Secondary | ICD-10-CM
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
## 2013-11-25 NOTE — Patient Instructions (Signed)
We have scheduled you for an upper endoscopy with dilation with Dr. Rourk in the near future.  Further recommendations to follow!   

## 2013-11-26 ENCOUNTER — Encounter: Payer: Self-pay | Admitting: Gastroenterology

## 2013-11-26 ENCOUNTER — Encounter (HOSPITAL_COMMUNITY): Payer: Self-pay | Admitting: Pharmacy Technician

## 2013-11-26 NOTE — Assessment & Plan Note (Signed)
63 year old male with chronic dysphagia and history of several dilations in the past, presenting with need for repeat evaluation via EGD/ED. No alarm features noted, and GERD is controlled on Nexium. Would recommend BPE if recurrent dysphagia after this dilation; query an underlying motility disorder.   Proceed with upper endoscopy and dilation in the near future with Dr. Gala Romney. The risks, benefits, and alternatives have been discussed in detail with patient. They have stated understanding and desire to proceed.  Consider BPE after EGD if recurrent dysphagia Next routine screening colonoscopy 2019

## 2013-11-27 NOTE — Progress Notes (Signed)
cc'd to pcp 

## 2013-11-29 ENCOUNTER — Encounter (HOSPITAL_COMMUNITY): Payer: Self-pay | Admitting: *Deleted

## 2013-11-29 ENCOUNTER — Ambulatory Visit (HOSPITAL_COMMUNITY)
Admission: RE | Admit: 2013-11-29 | Discharge: 2013-11-29 | Disposition: A | Discharge status: Home / Self Care | Payer: 59 | Source: Ambulatory Visit | Attending: Internal Medicine | Admitting: Internal Medicine

## 2013-11-29 ENCOUNTER — Encounter (HOSPITAL_COMMUNITY)
Admission: RE | Disposition: A | Discharge status: Home / Self Care | Payer: Self-pay | Source: Ambulatory Visit | Attending: Internal Medicine

## 2013-11-29 DIAGNOSIS — K222 Esophageal obstruction: Secondary | ICD-10-CM

## 2013-11-29 DIAGNOSIS — Q391 Atresia of esophagus with tracheo-esophageal fistula: Secondary | ICD-10-CM

## 2013-11-29 DIAGNOSIS — K2100 Gastro-esophageal reflux disease with esophagitis, without bleeding: Secondary | ICD-10-CM

## 2013-11-29 DIAGNOSIS — Z79899 Other long term (current) drug therapy: Secondary | ICD-10-CM | POA: Insufficient documentation

## 2013-11-29 DIAGNOSIS — Z9119 Patient's noncompliance with other medical treatment and regimen: Secondary | ICD-10-CM | POA: Insufficient documentation

## 2013-11-29 DIAGNOSIS — Q393 Congenital stenosis and stricture of esophagus: Secondary | ICD-10-CM

## 2013-11-29 DIAGNOSIS — Z91199 Patient's noncompliance with other medical treatment and regimen due to unspecified reason: Secondary | ICD-10-CM | POA: Insufficient documentation

## 2013-11-29 DIAGNOSIS — M129 Arthropathy, unspecified: Secondary | ICD-10-CM | POA: Insufficient documentation

## 2013-11-29 DIAGNOSIS — R1310 Dysphagia, unspecified: Secondary | ICD-10-CM

## 2013-11-29 DIAGNOSIS — Z791 Long term (current) use of non-steroidal anti-inflammatories (NSAID): Secondary | ICD-10-CM | POA: Insufficient documentation

## 2013-11-29 HISTORY — PX: SAVORY DILATION: SHX5439

## 2013-11-29 HISTORY — PX: MALONEY DILATION: SHX5535

## 2013-11-29 HISTORY — PX: ESOPHAGOGASTRODUODENOSCOPY: SHX5428

## 2013-11-29 SURGERY — EGD (ESOPHAGOGASTRODUODENOSCOPY)
Anesthesia: Moderate Sedation

## 2013-11-29 MED ORDER — MIDAZOLAM HCL 5 MG/5ML IJ SOLN
INTRAMUSCULAR | Status: AC
  Filled 2013-11-29: qty 10

## 2013-11-29 MED ORDER — ONDANSETRON HCL 4 MG/2ML IJ SOLN
INTRAMUSCULAR | Status: AC
  Filled 2013-11-29: qty 2

## 2013-11-29 MED ORDER — MEPERIDINE HCL 100 MG/ML IJ SOLN
INTRAMUSCULAR | Status: AC
  Filled 2013-11-29: qty 2

## 2013-11-29 MED ORDER — SODIUM CHLORIDE 0.9 % IV SOLN
INTRAVENOUS | Status: DC
  Administered 2013-11-29: 11:00:00 via INTRAVENOUS

## 2013-11-29 MED ORDER — STERILE WATER FOR IRRIGATION IR SOLN
Status: DC | PRN
  Administered 2013-11-29: 11:00:00

## 2013-11-29 MED ORDER — ONDANSETRON HCL 4 MG/2ML IJ SOLN
INTRAMUSCULAR | Status: DC | PRN
  Administered 2013-11-29: 4 mg via INTRAVENOUS

## 2013-11-29 MED ORDER — LIDOCAINE VISCOUS 2 % MT SOLN
OROMUCOSAL | Status: AC
  Filled 2013-11-29: qty 15

## 2013-11-29 MED ORDER — MEPERIDINE HCL 100 MG/ML IJ SOLN
INTRAMUSCULAR | Status: DC | PRN
  Administered 2013-11-29: 50 mg via INTRAVENOUS
  Administered 2013-11-29: 25 mg via INTRAVENOUS

## 2013-11-29 MED ORDER — MIDAZOLAM HCL 5 MG/5ML IJ SOLN
INTRAMUSCULAR | Status: DC | PRN
  Administered 2013-11-29: 2 mg via INTRAVENOUS
  Administered 2013-11-29: 1 mg via INTRAVENOUS
  Administered 2013-11-29: 2 mg via INTRAVENOUS

## 2013-11-29 NOTE — Discharge Instructions (Signed)
EGD Discharge instructions Please read the instructions outlined below and refer to this sheet in the next few weeks. These discharge instructions provide you with general information on caring for yourself after you leave the hospital. Your doctor may also give you specific instructions. While your treatment has been planned according to the most current medical practices available, unavoidable complications occasionally occur. If you have any problems or questions after discharge, please call your doctor. ACTIVITY  You may resume your regular activity but move at a slower pace for the next 24 hours.   Take frequent rest periods for the next 24 hours.   Walking will help expel (get rid of) the air and reduce the bloated feeling in your abdomen.   No driving for 24 hours (because of the anesthesia (medicine) used during the test).   You may shower.   Do not sign any important legal documents or operate any machinery for 24 hours (because of the anesthesia used during the test).  NUTRITION  Drink plenty of fluids.   You may resume your normal diet.   Begin with a light meal and progress to your normal diet.   Avoid alcoholic beverages for 24 hours or as instructed by your caregiver.  MEDICATIONS  You may resume your normal medications unless your caregiver tells you otherwise.  WHAT YOU CAN EXPECT TODAY  You may experience abdominal discomfort such as a feeling of fullness or gas pains.  FOLLOW-UP  Your doctor will discuss the results of your test with you.  SEEK IMMEDIATE MEDICAL ATTENTION IF ANY OF THE FOLLOWING OCCUR:  Excessive nausea (feeling sick to your stomach) and/or vomiting.   Severe abdominal pain and distention (swelling).   Trouble swallowing.   Temperature over 101 F (37.8 C).   Rectal bleeding or vomiting of blood.     Resume Nexium 40 mg daily-take this medication every day  GERD information provided  Swallowing precautions reviewed  Office  visit with [place] in one year. Gastroesophageal Reflux Disease, Adult Gastroesophageal reflux disease (GERD) happens when acid from your stomach flows up into the esophagus. When acid comes in contact with the esophagus, the acid causes soreness (inflammation) in the esophagus. Over time, GERD may create small holes (ulcers) in the lining of the esophagus. CAUSES   Increased body weight. This puts pressure on the stomach, making acid rise from the stomach into the esophagus.  Smoking. This increases acid production in the stomach.  Drinking alcohol. This causes decreased pressure in the lower esophageal sphincter (valve or ring of muscle between the esophagus and stomach), allowing acid from the stomach into the esophagus.  Late evening meals and a full stomach. This increases pressure and acid production in the stomach.  A malformed lower esophageal sphincter. Sometimes, no cause is found. SYMPTOMS   Burning pain in the lower part of the mid-chest behind the breastbone and in the mid-stomach area. This may occur twice a week or more often.  Trouble swallowing.  Sore throat.  Dry cough.  Asthma-like symptoms including chest tightness, shortness of breath, or wheezing. DIAGNOSIS  Your caregiver may be able to diagnose GERD based on your symptoms. In some cases, X-rays and other tests may be done to check for complications or to check the condition of your stomach and esophagus. TREATMENT  Your caregiver may recommend over-the-counter or prescription medicines to help decrease acid production. Ask your caregiver before starting or adding any new medicines.  HOME CARE INSTRUCTIONS   Change the factors that you  can control. Ask your caregiver for guidance concerning weight loss, quitting smoking, and alcohol consumption.  Avoid foods and drinks that make your symptoms worse, such as:  Caffeine or alcoholic drinks.  Chocolate.  Peppermint or mint flavorings.  Garlic and  onions.  Spicy foods.  Citrus fruits, such as oranges, lemons, or limes.  Tomato-based foods such as sauce, chili, salsa, and pizza.  Fried and fatty foods.  Avoid lying down for the 3 hours prior to your bedtime or prior to taking a nap.  Eat small, frequent meals instead of large meals.  Wear loose-fitting clothing. Do not wear anything tight around your waist that causes pressure on your stomach.  Raise the head of your bed 6 to 8 inches with wood blocks to help you sleep. Extra pillows will not help.  Only take over-the-counter or prescription medicines for pain, discomfort, or fever as directed by your caregiver.  Do not take aspirin, ibuprofen, or other nonsteroidal anti-inflammatory drugs (NSAIDs). SEEK IMMEDIATE MEDICAL CARE IF:   You have pain in your arms, neck, jaw, teeth, or back.  Your pain increases or changes in intensity or duration.  You develop nausea, vomiting, or sweating (diaphoresis).  You develop shortness of breath, or you faint.  Your vomit is green, yellow, black, or looks like coffee grounds or blood.  Your stool is red, bloody, or black. These symptoms could be signs of other problems, such as heart disease, gastric bleeding, or esophageal bleeding. MAKE SURE YOU:   Understand these instructions.  Will watch your condition.  Will get help right away if you are not doing well or get worse. Document Released: 04/13/2005 Document Revised: 09/26/2011 Document Reviewed: 01/21/2011 [facility] Patient Information 2014 [place], [place].

## 2013-11-29 NOTE — Op Note (Signed)
[facility] [address] [place], [address]   ENDOSCOPY PROCEDURE REPORT  PATIENT: Antonio Daniel, Antonio Daniel  MR#: [id number] BIRTHDATE: 10-03-1950 , 62  yrs. old GENDER: Male ENDOSCOPIST: R.  Garfield Cornea, MD [facility] REFERRED BY:  Collene Mares, PA-C PROCEDURE DATE:  11/29/2013 PROCEDURE:     EGD with Venia Minks dilation  INDICATIONS:      Recurrent esophageal dysphagia; known Schatzki's ring; noncompliance with acid suppression therapy  INFORMED CONSENT:   The risks, benefits, limitations, alternatives and imponderables have been discussed.  The potential for biopsy, esophogeal dilation, etc. have also been reviewed.  Questions have been answered.  All parties agreeable.  Please see the history and physical in the medical record for more information.  MEDICATIONS:       Versed 5 mg IV and Demerol 75 mg IV in divided doses. Zofran 4 mg IV. Xylocaine gel orally  DESCRIPTION OF PROCEDURE:   The EG-[id number] ([id number])  endoscope was introduced through the mouth and advanced to the second portion of the duodenum without difficulty or limitations.  The mucosal surfaces were surveyed very carefully during advancement of the scope and upon withdrawal.  Retroflexion view of the proximal stomach and esophagogastric junction was performed.      FINDINGS:    Schatzki's ring with superimposed peptic stricture it was short. There were some overlying erosions will. No Barrett's esophagus or tumor. Stomach empty. Normal gastric mucosa. Patent pylorus. Normal first and second portion of the duodenum.  THERAPEUTIC / DIAGNOSTIC MANEUVERS PERFORMED:  I attempted to pass a 79 French Maloney dilator however, could not get it started into the proximal esophagus due to resistance and patient's intolerance. Subsequently, I backed off to a 42 [place] Maloney dilator  - passed to full insertion with minimal resistance upon full insertion. A look back revealed the ring/stricture had  been disrupted  without apparent complication. Minimal bleeding.   COMPLICATIONS:  None  IMPRESSION:  Short distal peptic stricture superposed a Schatzki's ring. Progressive reflux esophagitis. Status post dilation as described above.  Patient has complicated reflux esophagitis. He needs to be on a daily PPI indefinitely.  RECOMMENDATIONS:  Resume Nexium 40 mg daily.  Office visit with [place] in one year.    _______________________________ R. Garfield Cornea, MD FACP [facility] eSigned:  R. Garfield Cornea, MD FACP [facility] 11/29/2013 11:53 AM     CC:  PATIENT NAME:  [name], [name] MR#: [id number]

## 2013-11-29 NOTE — Interval H&P Note (Signed)
History and Physical Interval Note:  11/29/2013 11:29 AM  Antonio Daniel  has presented today for surgery, with the diagnosis of DYSPHAGIA  The various methods of treatment have been discussed with the patient and family. After consideration of risks, benefits and other options for treatment, the patient has consented to  Procedure(s) with comments: ESOPHAGOGASTRODUODENOSCOPY (EGD) (N/A) - 1:00-moved to [place] notified pt SAVORY DILATION (N/A) MALONEY DILATION (N/A) as a surgical intervention .  The patient's history has been reviewed, patient examined, no change in status, stable for surgery.  I have reviewed the patient's chart and labs.  Questions were answered to the patient's satisfaction.     Cristopher Estimable Rourk  No regular PPI. No change. EGD with esophageal dilation, etc. as appropriate.The risks, benefits, limitations, alternatives and imponderables have been reviewed with the patient. Potential for esophageal dilation, biopsy, etc. have also been reviewed.  Questions have been answered. All parties agreeable.
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
## 2013-12-02 ENCOUNTER — Encounter (HOSPITAL_COMMUNITY): Payer: Self-pay | Admitting: Internal Medicine

## 2014-11-17 ENCOUNTER — Encounter: Payer: Self-pay | Admitting: Internal Medicine

## 2015-07-09 ENCOUNTER — Ambulatory Visit (HOSPITAL_COMMUNITY)
Admission: RE | Admit: 2015-07-09 | Discharge: 2015-07-09 | Disposition: A | Discharge status: Home / Self Care | Payer: 59 | Source: Ambulatory Visit | Attending: Physician Assistant | Admitting: Physician Assistant

## 2015-07-09 ENCOUNTER — Other Ambulatory Visit (HOSPITAL_COMMUNITY): Payer: Self-pay | Admitting: Physician Assistant

## 2015-07-09 DIAGNOSIS — M25512 Pain in left shoulder: Secondary | ICD-10-CM

## 2015-07-09 DIAGNOSIS — R079 Chest pain, unspecified: Secondary | ICD-10-CM | POA: Insufficient documentation

## 2015-11-24 ENCOUNTER — Ambulatory Visit (INDEPENDENT_AMBULATORY_CARE_PROVIDER_SITE_OTHER): Payer: 59 | Admitting: Internal Medicine

## 2015-11-24 ENCOUNTER — Encounter: Payer: Self-pay | Admitting: Internal Medicine

## 2015-11-24 ENCOUNTER — Other Ambulatory Visit: Payer: Self-pay

## 2015-11-24 VITALS — BP 127/75 | HR 68 | Temp 98.8°F | Ht 71.0 in | Wt 177.0 lb

## 2015-11-24 DIAGNOSIS — K219 Gastro-esophageal reflux disease without esophagitis: Secondary | ICD-10-CM | POA: Diagnosis not present

## 2015-11-24 DIAGNOSIS — R1310 Dysphagia, unspecified: Secondary | ICD-10-CM

## 2015-11-24 DIAGNOSIS — K222 Esophageal obstruction: Secondary | ICD-10-CM
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
## 2015-11-24 NOTE — Patient Instructions (Signed)
Schedule EGD with esophageal dilation-GERD and recurrent esophageal dysphagia; history of esophageal ring  Conscious sedation.  Further recommendations to follow.

## 2015-11-25 ENCOUNTER — Encounter (HOSPITAL_COMMUNITY)
Admission: RE | Disposition: A | Discharge status: Home / Self Care | Payer: Self-pay | Source: Ambulatory Visit | Attending: Internal Medicine

## 2015-11-25 ENCOUNTER — Encounter (HOSPITAL_COMMUNITY): Payer: Self-pay | Admitting: *Deleted

## 2015-11-25 ENCOUNTER — Ambulatory Visit (HOSPITAL_COMMUNITY)
Admission: RE | Admit: 2015-11-25 | Discharge: 2015-11-25 | Disposition: A | Discharge status: Home / Self Care | Payer: 59 | Source: Ambulatory Visit | Attending: Internal Medicine | Admitting: Internal Medicine

## 2015-11-25 DIAGNOSIS — R1319 Other dysphagia: Secondary | ICD-10-CM | POA: Insufficient documentation

## 2015-11-25 DIAGNOSIS — R12 Heartburn: Secondary | ICD-10-CM | POA: Insufficient documentation

## 2015-11-25 DIAGNOSIS — K449 Diaphragmatic hernia without obstruction or gangrene: Secondary | ICD-10-CM | POA: Diagnosis not present

## 2015-11-25 DIAGNOSIS — K2100 Gastro-esophageal reflux disease with esophagitis, without bleeding: Secondary | ICD-10-CM | POA: Insufficient documentation

## 2015-11-25 DIAGNOSIS — K209 Esophagitis, unspecified: Secondary | ICD-10-CM | POA: Diagnosis not present

## 2015-11-25 DIAGNOSIS — M1991 Primary osteoarthritis, unspecified site: Secondary | ICD-10-CM | POA: Diagnosis not present

## 2015-11-25 DIAGNOSIS — K222 Esophageal obstruction: Secondary | ICD-10-CM | POA: Insufficient documentation

## 2015-11-25 DIAGNOSIS — R1310 Dysphagia, unspecified: Secondary | ICD-10-CM

## 2015-11-25 HISTORY — PX: MALONEY DILATION: SHX5535

## 2015-11-25 HISTORY — PX: ESOPHAGOGASTRODUODENOSCOPY: SHX5428

## 2015-11-25 SURGERY — EGD (ESOPHAGOGASTRODUODENOSCOPY)
Anesthesia: Moderate Sedation

## 2015-11-25 MED ORDER — LIDOCAINE VISCOUS 2 % MT SOLN
OROMUCOSAL | Status: AC
  Filled 2015-11-25: qty 15

## 2015-11-25 MED ORDER — MIDAZOLAM HCL 5 MG/5ML IJ SOLN
INTRAMUSCULAR | Status: DC | PRN
  Administered 2015-11-25: 1 mg via INTRAVENOUS
  Administered 2015-11-25 (×2): 2 mg via INTRAVENOUS

## 2015-11-25 MED ORDER — STERILE WATER FOR IRRIGATION IR SOLN
Status: DC | PRN
  Administered 2015-11-25: 08:00:00

## 2015-11-25 MED ORDER — SODIUM CHLORIDE 0.9 % IV SOLN
INTRAVENOUS | Status: DC
  Administered 2015-11-25: 07:00:00 via INTRAVENOUS

## 2015-11-25 MED ORDER — MEPERIDINE HCL 100 MG/ML IJ SOLN
INTRAMUSCULAR | Status: DC | PRN
  Administered 2015-11-25 (×2): 25 mg via INTRAVENOUS
  Administered 2015-11-25: 50 mg via INTRAVENOUS

## 2015-11-25 MED ORDER — LIDOCAINE VISCOUS 2 % MT SOLN
OROMUCOSAL | Status: DC | PRN
  Administered 2015-11-25: 3 mL via OROMUCOSAL

## 2015-11-25 MED ORDER — MIDAZOLAM HCL 5 MG/5ML IJ SOLN
INTRAMUSCULAR | Status: AC
  Filled 2015-11-25: qty 10

## 2015-11-25 MED ORDER — ONDANSETRON HCL 4 MG/2ML IJ SOLN
INTRAMUSCULAR | Status: AC
  Filled 2015-11-25: qty 2

## 2015-11-25 MED ORDER — MEPERIDINE HCL 100 MG/ML IJ SOLN
INTRAMUSCULAR | Status: AC
  Filled 2015-11-25: qty 2

## 2015-11-25 MED ORDER — ONDANSETRON HCL 4 MG/2ML IJ SOLN
INTRAMUSCULAR | Status: DC | PRN
Start: 2015-11-25 — End: 2015-11-25
  Administered 2015-11-25: 4 mg via INTRAVENOUS
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
## 2015-11-25 NOTE — Progress Notes (Signed)
Please excuse Antonio Daniel from work on Wednesday 11/25/2015.  He may not drive , operate heavy machinery, or sign legal documents for 24 hours. He may return to work, drive or operate machinery after 9:30 11/26/2015.

## 2015-11-25 NOTE — Interval H&P Note (Signed)
History and Physical Interval Note:  11/25/2015 8:07 AM  Antonio Daniel  has presented today for surgery, with the diagnosis of DYSPHAGIA/HX OF ESOPHAGEAL RING  The various methods of treatment have been discussed with the patient and family. After consideration of risks, benefits and other options for treatment, the patient has consented to  Procedure(s) with comments: ESOPHAGOGASTRODUODENOSCOPY (EGD) (N/A) - [address] (N/A) as a surgical intervention .  The patient's history has been reviewed, patient examined, no change in status, stable for surgery.  I have reviewed the patient's chart and labs.  Questions were answered to the patient's satisfaction.     Antonio Daniel  No change. EGD/EGD per plan.  The risks, benefits, limitations, alternatives and imponderables have been reviewed with the patient. Potential for esophageal dilation, biopsy, etc. have also been reviewed.  Questions have been answered. All parties agreeable.

## 2015-11-25 NOTE — Op Note (Signed)
[facility] Patient Name: Antonio Daniel Procedure Date: 11/25/2015 8:03 AM MRN: [id number] Date of Birth: Jun 24, 1951 Attending MD: Norvel Richards , MD CSN: [id number] Age: 65 Admit Type: Outpatient Procedure:                Upper GI endoscopy Indications:              Heartburn; esophageal dysphagia Providers:                Norvel Richards, MD, Gwenlyn Fudge, RN, Randa Spike, Technician Referring MD:              Medicines:                Midazolam 5 mg IV, Meperidine 123XX123 mg IV Complications:            No immediate complications. Estimated Blood Loss:     Estimated blood loss was minimal. Procedure:                Pre-Anesthesia Assessment:                           - Prior to the procedure, a History and Physical                            was performed, and patient medications and                            allergies were reviewed. The patient's tolerance of                            previous anesthesia was also reviewed. The risks                            and benefits of the procedure and the sedation                            options and risks were discussed with the patient.                            All questions were answered, and informed consent                            was obtained. Prior Anticoagulants: The patient has                            taken no previous anticoagulant or antiplatelet                            agents. ASA Grade Assessment: II - A patient with                            mild systemic disease. After reviewing the risks  and benefits, the patient was deemed in                            satisfactory condition to undergo the procedure.                           After obtaining informed consent, the endoscope was                            passed under direct vision. Throughout the                            procedure, the patient's blood pressure, pulse, and                oxygen saturations were monitored continuously. The                            [id number]) scope was introduced through the                            mouth, and advanced to the second part of duodenum.                            The upper GI endoscopy was accomplished without                            difficulty. The patient tolerated the procedure                            well. Scope In: 8:19:43 AM Scope Out: 8:32:09 AM Total Procedure Duration: 0 hours 12 minutes 26 seconds  Findings:      A mild Schatzki ring (acquired) was found at the gastroesophageal       junction. Estimated blood loss was minimal. The scope was withdrawn.       Dilation was performed with a Maloney dilator with no resistance at 58       Fr. The scope was withdrawn. Dilation was performed with a Maloney       dilator with no resistance at 56 Fr. The dilation site was examined       following endoscope reinsertion and showed. Estimated blood loss was       minimal.      LA Grade B (one or more mucosal breaks greater than 5 mm, not extending       between the tops of two mucosal folds) esophagitis was found 35 to 36 cm       from the incisors. No Barrett's epithelium seen.      A small hiatal hernia was present. appendectomy remainder the gastric       mucosa appeared normal.      The second portion of the duodenum was normal. Impression:               - Mild Schatzki ring. Dilated.                           - LA Grade B esophagitis.                           -  Small hiatal hernia.                           - Normal second portion of the duodenum.                           - No specimens collected. Moderate Sedation:      Moderate (conscious) sedation was administered by the endoscopy nurse       and supervised by the endoscopist. The following parameters were       monitored: oxygen saturation, heart rate, blood pressure, respiratory       rate, EKG, adequacy of pulmonary ventilation, and  response to care.       Total physician intraservice time was 28 minutes. Recommendation:           - Patient has a contact number available for                            emergencies. The signs and symptoms of potential                            delayed complications were discussed with the                            patient. Return to normal activities tomorrow.                            Written discharge instructions were provided to the                            patient.                           - Advance diet as tolerated today.                           - Continue present medications.                           - Use Aciphex (rabeprazole) 20 mg PO daily PRN. the                            benefits far outweigh the risks.                           - Return to GI office in 6 months. Procedure Code(s):        --- Professional ---                           [phone], Esophagogastroduodenoscopy, flexible,                            transoral; diagnostic, including collection of                            specimen(s) by brushing or washing, when performed                            (  separate procedure)                           99152, Moderate sedation services provided by the                            same physician or other qualified health care                            professional performing the diagnostic or                            therapeutic service that the sedation supports,                            requiring the presence of an independent trained                            observer to assist in the monitoring of the                            patient's level of consciousness and physiological                            status; initial 15 minutes of intraservice time,                            patient age 75 years or older                           [phone], Moderate sedation services; each additional                            15 minutes intraservice time Diagnosis Code(s):         --- Professional ---                           K22.2, Esophageal obstruction                           K20.9, Esophagitis, unspecified                           K44.9, Diaphragmatic hernia without obstruction or                            gangrene                           R12, Heartburn CPT copyright 2016 American Medical Association. All rights reserved. The codes documented in this report are preliminary and upon coder review may  be revised to meet current compliance requirements. Cristopher Estimable. Artavius Stearns, MD Norvel Richards, MD 11/25/2015 8:49:03 AM This report has been signed electronically. Number of Addenda: 0

## 2015-11-25 NOTE — Discharge Instructions (Signed)
EGD Discharge instructions Please read the instructions outlined below and refer to this sheet in the next few weeks. These discharge instructions provide you with general information on caring for yourself after you leave the hospital. Your doctor may also give you specific instructions. While your treatment has been planned according to the most current medical practices available, unavoidable complications occasionally occur. If you have any problems or questions after discharge, please call your doctor. ACTIVITY  You may resume your regular activity but move at a slower pace for the next 24 hours.   Take frequent rest periods for the next 24 hours.   Walking will help expel (get rid of) the air and reduce the bloated feeling in your abdomen.   No driving for 24 hours (because of the anesthesia (medicine) used during the test).   You may shower.   Do not sign any important legal documents or operate any machinery for 24 hours (because of the anesthesia used during the test).  NUTRITION  Drink plenty of fluids.   You may resume your normal diet.   Begin with a light meal and progress to your normal diet.   Avoid alcoholic beverages for 24 hours or as instructed by your caregiver.  MEDICATIONS  You may resume your normal medications unless your caregiver tells you otherwise.  WHAT YOU CAN EXPECT TODAY  You may experience abdominal discomfort such as a feeling of fullness or gas pains.  FOLLOW-UP  Your doctor will discuss the results of your test with you.  SEEK IMMEDIATE MEDICAL ATTENTION IF ANY OF THE FOLLOWING OCCUR:  Excessive nausea (feeling sick to your stomach) and/or vomiting.   Severe abdominal pain and distention (swelling).   Trouble swallowing.   Temperature over 101 F (37.8 C).   Rectal bleeding or vomiting of blood.       Begin AcipHex 20 mg daily. The benefits of this approach outweigh any potential risks.  If you do not want to start the above  medicine, then use Pepcid Complete for control of your reflux symptoms instead of regular antacids  GERD information provided  Office visit with [place] in 6 months    Gastroesophageal Reflux Disease, Adult Normally, food travels down the esophagus and stays in the stomach to be digested. However, when a person has gastroesophageal reflux disease (GERD), food and stomach acid move back up into the esophagus. When this happens, the esophagus becomes sore and inflamed. Over time, GERD can create small holes (ulcers) in the lining of the esophagus.  CAUSES This condition is caused by a problem with the muscle between the esophagus and the stomach (lower esophageal sphincter, or LES). Normally, the LES muscle closes after food passes through the esophagus to the stomach. When the LES is weakened or abnormal, it does not close properly, and that allows food and stomach acid to go back up into the esophagus. The LES can be weakened by certain dietary substances, medicines, and medical conditions, including:  Tobacco use.  Pregnancy.  Having a hiatal hernia.  Heavy alcohol use.  Certain foods and beverages, such as coffee, chocolate, onions, and peppermint. RISK FACTORS This condition is more likely to develop in:  People who have an increased body weight.  People who have connective tissue disorders.  People who use NSAID medicines. SYMPTOMS Symptoms of this condition include:  Heartburn.  Difficult or painful swallowing.  The feeling of having a lump in the throat.  Abitter taste in the mouth.  Bad breath.  Having a  large amount of saliva.  Having an upset or bloated stomach.  Belching.  Chest pain.  Shortness of breath or wheezing.  Ongoing (chronic) cough or a night-time cough.  Wearing away of tooth enamel.  Weight loss. Different conditions can cause chest pain. Make sure to see your health care provider if you experience chest pain. DIAGNOSIS Your health care  provider will take a medical history and perform a physical exam. To determine if you have mild or severe GERD, your health care provider may also monitor how you respond to treatment. You may also have other tests, including:  An endoscopy toexamine your stomach and esophagus with a small camera.  A test thatmeasures the acidity level in your esophagus.  A test thatmeasures how much pressure is on your esophagus.  A barium swallow or modified barium swallow to show the shape, size, and functioning of your esophagus. TREATMENT The goal of treatment is to help relieve your symptoms and to prevent complications. Treatment for this condition may vary depending on how severe your symptoms are. Your health care provider may recommend:  Changes to your diet.  Medicine.  Surgery. HOME CARE INSTRUCTIONS Diet  Follow a diet as recommended by your health care provider. This may involve avoiding foods and drinks such as:  Coffee and tea (with or without caffeine).  Drinks that containalcohol.  Energy drinks and sports drinks.  Carbonated drinks or sodas.  Chocolate and cocoa.  Peppermint and mint flavorings.  Garlic and onions.  Horseradish.  Spicy and acidic foods, including peppers, chili powder, curry powder, vinegar, hot sauces, and barbecue sauce.  Citrus fruit juices and citrus fruits, such as oranges, lemons, and limes.  Tomato-based foods, such as red sauce, chili, salsa, and pizza with red sauce.  Fried and fatty foods, such as donuts, french fries, potato chips, and high-fat dressings.  High-fat meats, such as hot dogs and fatty cuts of red and white meats, such as rib eye steak, sausage, ham, and bacon.  High-fat dairy items, such as whole milk, butter, and cream cheese.  Eat small, frequent meals instead of large meals.  Avoid drinking large amounts of liquid with your meals.  Avoid eating meals during the 2-3 hours before bedtime.  Avoid lying down right  after you eat.  Do not exercise right after you eat. General Instructions  Pay attention to any changes in your symptoms.  Take over-the-counter and prescription medicines only as told by your health care provider. Do not take aspirin, ibuprofen, or other NSAIDs unless your health care provider told you to do so.  Do not use any tobacco products, including cigarettes, chewing tobacco, and e-cigarettes. If you need help quitting, ask your health care provider.  Wear loose-fitting clothing. Do not wear anything tight around your waist that causes pressure on your abdomen.  Raise (elevate) the head of your bed 6 inches (15cm).  Try to reduce your stress, such as with yoga or meditation. If you need help reducing stress, ask your health care provider.  If you are overweight, reduce your weight to an amount that is healthy for you. Ask your health care provider for guidance about a safe weight loss goal.  Keep all follow-up visits as told by your health care provider. This is important. SEEK MEDICAL CARE IF:  You have new symptoms.  You have unexplained weight loss.  You have difficulty swallowing, or it hurts to swallow.  You have wheezing or a persistent cough.  Your symptoms do not improve  with treatment.  You have a hoarse voice. SEEK IMMEDIATE MEDICAL CARE IF:  You have pain in your arms, neck, jaw, teeth, or back.  You feel sweaty, dizzy, or light-headed.  You have chest pain or shortness of breath.  You vomit and your vomit looks like blood or coffee grounds.  You faint.  Your stool is bloody or black.  You cannot swallow, drink, or eat.   This information is not intended to replace advice given to you by your health care provider. Make sure you discuss any questions you have with your health care provider.   Document Released: 04/13/2005 Document Revised: 03/25/2015 Document Reviewed: 10/29/2014 Elsevier Interactive Patient Education [employer].

## 2015-11-26 ENCOUNTER — Encounter (HOSPITAL_COMMUNITY): Payer: Self-pay | Admitting: Internal Medicine

## 2015-12-08 ENCOUNTER — Ambulatory Visit: Payer: Self-pay | Admitting: General Surgery

## 2016-01-22 NOTE — Pre-Procedure Instructions (Signed)
    Antonio Daniel  01/22/2016        Your procedure is scheduled on Monday, July 17th, 2017.   Report to [facility] Admitting at 10:30 A.M.   Call this number if you have problems the morning of surgery:  [phone]   Remember:  Do not eat food or drink liquids after midnight.   Take these medicines the morning of surgery with A SIP OF WATER: None.  7 days prior to surgery, stop taking: Meloxicam (Mobic), Ibuprofen, Advil, Motrin, Aspirin, BC's, Goody's, Naproxen, Aleve, Fish oil, all herbal medications, and all vitamins.    Do not wear jewelry.  Do not wear lotions, powders, or colognes.    Men may shave face and neck.  Do not bring valuables to the hospital.  [facility] is not responsible for any belongings or valuables.  Contacts, dentures or bridgework may not be worn into surgery.  Leave your suitcase in the car.  After surgery it may be brought to your room.  For patients admitted to the hospital, discharge time will be determined by your treatment team.  Patients discharged the day of surgery will not be allowed to drive home.   Special instructions:  Preparing for Surgery      [facility]- Preparing For Surgery  Before surgery, you can play an important role. Because skin is not sterile, your skin needs to be as free of germs as possible. You can reduce the number of germs on your skin by washing with CHG (chlorahexidine gluconate) Soap before surgery.  CHG is an antiseptic cleaner which kills germs and bonds with the skin to continue killing germs even after washing.  Please do not use if you have an allergy to CHG or antibacterial soaps. If your skin becomes reddened/irritated stop using the CHG.  Do not shave (including legs and underarms) for at least 48 hours prior to first CHG shower. It is OK to shave your face.  Please follow these instructions carefully.   1. Shower the NIGHT BEFORE SURGERY and the MORNING OF SURGERY with CHG.   2. If  you chose to wash your hair, wash your hair first as usual with your normal shampoo.  3. After you shampoo, rinse your hair and body thoroughly to remove the shampoo.  4. Use CHG as you would any other liquid soap. You can apply CHG directly to the skin and wash gently with a scrungie or a clean washcloth.   5. Apply the CHG Soap to your body ONLY FROM THE NECK DOWN.  Do not use on open wounds or open sores. Avoid contact with your eyes, ears, mouth and genitals (private parts). Wash genitals (private parts) with your normal soap.  6. Wash thoroughly, paying special attention to the area where your surgery will be performed.  7. Thoroughly rinse your body with warm water from the neck down.  8. DO NOT shower/wash with your normal soap after using and rinsing off the CHG Soap.  9. Pat yourself dry with a CLEAN TOWEL.   10. Wear CLEAN PAJAMAS   11. Place CLEAN SHEETS on your bed the night of your first shower and DO NOT SLEEP WITH PETS.  Day of Surgery: Do not apply any deodorants/lotions. Please wear clean clothes to the hospital/surgery center.

## 2016-01-25 ENCOUNTER — Encounter (HOSPITAL_COMMUNITY): Payer: Self-pay

## 2016-01-25 ENCOUNTER — Ambulatory Visit (HOSPITAL_COMMUNITY)
Admission: RE | Admit: 2016-01-25 | Discharge: 2016-01-25 | Disposition: A | Discharge status: Home / Self Care | Payer: 59 | Source: Ambulatory Visit | Attending: General Surgery | Admitting: General Surgery

## 2016-01-25 ENCOUNTER — Encounter (HOSPITAL_COMMUNITY)
Admission: RE | Admit: 2016-01-25 | Discharge: 2016-01-25 | Disposition: A | Discharge status: Home / Self Care | Payer: 59 | Source: Ambulatory Visit | Attending: General Surgery | Admitting: General Surgery

## 2016-01-25 ENCOUNTER — Ambulatory Visit (HOSPITAL_COMMUNITY): Payer: 59

## 2016-01-25 DIAGNOSIS — K4090 Unilateral inguinal hernia, without obstruction or gangrene, not specified as recurrent: Secondary | ICD-10-CM | POA: Diagnosis present

## 2016-01-25 DIAGNOSIS — Z539 Procedure and treatment not carried out, unspecified reason: Secondary | ICD-10-CM | POA: Diagnosis not present

## 2016-01-25 DIAGNOSIS — Z01818 Encounter for other preprocedural examination: Secondary | ICD-10-CM

## 2016-01-25 HISTORY — DX: Personal history of other diseases of the digestive system: Z87.19

## 2016-01-25 HISTORY — DX: Personal history of urinary (tract) infections: Z87.440

## 2016-01-25 LAB — BASIC METABOLIC PANEL
Anion gap: 8 (ref 5–15)
BUN: 13 mg/dL (ref 6–20)
CALCIUM: 9.4 mg/dL (ref 8.9–10.3)
CHLORIDE: 104 mmol/L (ref 101–111)
CO2: 26 mmol/L (ref 22–32)
CREATININE: 0.76 mg/dL (ref 0.61–1.24)
GLUCOSE: 99 mg/dL (ref 65–99)
Potassium: 3.9 mmol/L (ref 3.5–5.1)
Sodium: 138 mmol/L (ref 135–145)

## 2016-01-25 LAB — CBC WITH DIFFERENTIAL/PLATELET
BASOS ABS: 0 10*3/uL (ref 0.0–0.1)
BASOS PCT: 0 %
EOS PCT: 3 %
Eosinophils Absolute: 0.3 10*3/uL (ref 0.0–0.7)
HCT: 44.1 % (ref 39.0–52.0)
Hemoglobin: 14.5 g/dL (ref 13.0–17.0)
Lymphocytes Relative: 27 %
Lymphs Abs: 2.3 10*3/uL (ref 0.7–4.0)
MCH: 29.8 pg (ref 26.0–34.0)
MCHC: 32.9 g/dL (ref 30.0–36.0)
MCV: 90.6 fL (ref 78.0–100.0)
MONO ABS: 0.8 10*3/uL (ref 0.1–1.0)
MONOS PCT: 9 %
Neutro Abs: 5.1 10*3/uL (ref 1.7–7.7)
Neutrophils Relative %: 61 %
PLATELETS: 211 10*3/uL (ref 150–400)
RBC: 4.87 MIL/uL (ref 4.22–5.81)
RDW: 13.4 % (ref 11.5–15.5)
WBC: 8.4 10*3/uL (ref 4.0–10.5)

## 2016-01-25 NOTE — Progress Notes (Signed)
PCP: Redmond School, MD with [facility], EKG requested No cardiologist Pt denies Echo, cath, reports stress test >35yr ago EKG: 01/25/16 CXR: 01/25/16

## 2016-01-29 MED ORDER — CEFAZOLIN SODIUM-DEXTROSE 2-4 GM/100ML-% IV SOLN
2.0000 g | INTRAVENOUS | Status: AC
  Administered 2016-02-01: 2 g via INTRAVENOUS
  Filled 2016-01-29: qty 100
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
## 2016-02-01 ENCOUNTER — Ambulatory Visit (HOSPITAL_COMMUNITY): Payer: 59 | Admitting: Anesthesiology

## 2016-02-01 ENCOUNTER — Encounter (HOSPITAL_COMMUNITY)
Admission: RE | Disposition: A | Discharge status: Home / Self Care | Payer: Self-pay | Source: Ambulatory Visit | Attending: General Surgery

## 2016-02-01 ENCOUNTER — Encounter (HOSPITAL_COMMUNITY): Payer: Self-pay | Admitting: *Deleted

## 2016-02-01 ENCOUNTER — Ambulatory Visit (HOSPITAL_COMMUNITY)
Admission: RE | Admit: 2016-02-01 | Discharge: 2016-02-01 | Disposition: A | Discharge status: Home / Self Care | Payer: 59 | Source: Ambulatory Visit | Attending: General Surgery | Admitting: General Surgery

## 2016-02-01 DIAGNOSIS — Z96651 Presence of right artificial knee joint: Secondary | ICD-10-CM | POA: Diagnosis not present

## 2016-02-01 DIAGNOSIS — K219 Gastro-esophageal reflux disease without esophagitis: Secondary | ICD-10-CM | POA: Insufficient documentation

## 2016-02-01 DIAGNOSIS — K4091 Unilateral inguinal hernia, without obstruction or gangrene, recurrent: Secondary | ICD-10-CM | POA: Insufficient documentation

## 2016-02-01 HISTORY — PX: INGUINAL HERNIA REPAIR: SHX194

## 2016-02-01 HISTORY — PX: INSERTION OF MESH: SHX5868

## 2016-02-01 SURGERY — REPAIR, HERNIA, INGUINAL, ADULT
Anesthesia: General | Site: Inguinal | Laterality: Left

## 2016-02-01 MED ORDER — ROCURONIUM BROMIDE 50 MG/5ML IV SOLN
INTRAVENOUS | Status: AC
  Filled 2016-02-01: qty 1

## 2016-02-01 MED ORDER — EPHEDRINE SULFATE 50 MG/ML IJ SOLN
INTRAMUSCULAR | Status: DC | PRN
  Administered 2016-02-01: 10 mg via INTRAVENOUS

## 2016-02-01 MED ORDER — PROPOFOL 10 MG/ML IV BOLUS
INTRAVENOUS | Status: AC
  Filled 2016-02-01: qty 20

## 2016-02-01 MED ORDER — SUGAMMADEX SODIUM 200 MG/2ML IV SOLN
INTRAVENOUS | Status: DC | PRN
  Administered 2016-02-01: 2 mg via INTRAVENOUS

## 2016-02-01 MED ORDER — LIDOCAINE 2% (20 MG/ML) 5 ML SYRINGE
INTRAMUSCULAR | Status: AC
  Filled 2016-02-01: qty 5

## 2016-02-01 MED ORDER — SODIUM CHLORIDE 0.9 % IR SOLN
Status: DC | PRN
  Administered 2016-02-01: 500 mL

## 2016-02-01 MED ORDER — MIDAZOLAM HCL 2 MG/2ML IJ SOLN
INTRAMUSCULAR | Status: AC
  Administered 2016-02-01: 2 mg via INTRAVENOUS
  Filled 2016-02-01: qty 2

## 2016-02-01 MED ORDER — PROPOFOL 10 MG/ML IV BOLUS
INTRAVENOUS | Status: DC | PRN
  Administered 2016-02-01: 200 mg via INTRAVENOUS
  Administered 2016-02-01: 30 mg via INTRAVENOUS

## 2016-02-01 MED ORDER — CHLORHEXIDINE GLUCONATE 4 % EX LIQD: 1.0000 "application " | Freq: Once | CUTANEOUS | Status: DC

## 2016-02-01 MED ORDER — DEXAMETHASONE SODIUM PHOSPHATE 10 MG/ML IJ SOLN
INTRAMUSCULAR | Status: AC
  Filled 2016-02-01: qty 1

## 2016-02-01 MED ORDER — LACTATED RINGERS IV SOLN
INTRAVENOUS | Status: DC
  Administered 2016-02-01: 11:00:00 via INTRAVENOUS

## 2016-02-01 MED ORDER — DEXAMETHASONE SODIUM PHOSPHATE 4 MG/ML IJ SOLN
INTRAMUSCULAR | Status: DC | PRN
  Administered 2016-02-01: 10 mg via INTRAVENOUS

## 2016-02-01 MED ORDER — 0.9 % SODIUM CHLORIDE (POUR BTL) OPTIME
TOPICAL | Status: DC | PRN
  Administered 2016-02-01: 1000 mL

## 2016-02-01 MED ORDER — ONDANSETRON HCL 4 MG/2ML IJ SOLN
INTRAMUSCULAR | Status: DC | PRN
  Administered 2016-02-01: 4 mg via INTRAVENOUS

## 2016-02-01 MED ORDER — FENTANYL CITRATE (PF) 100 MCG/2ML IJ SOLN
INTRAMUSCULAR | Status: AC
  Administered 2016-02-01: 100 ug via INTRAVENOUS
  Filled 2016-02-01: qty 2

## 2016-02-01 MED ORDER — FENTANYL CITRATE (PF) 100 MCG/2ML IJ SOLN
100.0000 ug | Freq: Once | INTRAMUSCULAR | Status: AC
  Administered 2016-02-01: 100 ug via INTRAVENOUS

## 2016-02-01 MED ORDER — BUPIVACAINE HCL (PF) 0.25 % IJ SOLN
INTRAMUSCULAR | Status: DC | PRN
  Administered 2016-02-01: 10 mL

## 2016-02-01 MED ORDER — MIDAZOLAM HCL 2 MG/2ML IJ SOLN
2.0000 mg | Freq: Once | INTRAMUSCULAR | Status: AC
  Administered 2016-02-01: 2 mg via INTRAVENOUS

## 2016-02-01 MED ORDER — EPHEDRINE 5 MG/ML INJ
INTRAVENOUS | Status: AC
  Filled 2016-02-01: qty 10

## 2016-02-01 MED ORDER — SUGAMMADEX SODIUM 200 MG/2ML IV SOLN
INTRAVENOUS | Status: AC
  Filled 2016-02-01: qty 2

## 2016-02-01 MED ORDER — ROCURONIUM BROMIDE 100 MG/10ML IV SOLN
INTRAVENOUS | Status: DC | PRN
  Administered 2016-02-01: 50 mg via INTRAVENOUS

## 2016-02-01 MED ORDER — BUPIVACAINE HCL (PF) 0.25 % IJ SOLN
INTRAMUSCULAR | Status: AC
  Filled 2016-02-01: qty 30

## 2016-02-01 MED ORDER — OXYCODONE-ACETAMINOPHEN 10-325 MG PO TABS: 1.0000 | ORAL_TABLET | ORAL | Status: DC | PRN

## 2016-02-01 MED ORDER — FENTANYL CITRATE (PF) 250 MCG/5ML IJ SOLN
INTRAMUSCULAR | Status: AC
  Filled 2016-02-01: qty 5

## 2016-02-01 MED ORDER — FENTANYL CITRATE (PF) 100 MCG/2ML IJ SOLN
INTRAMUSCULAR | Status: DC | PRN
  Administered 2016-02-01: 50 ug via INTRAVENOUS

## 2016-02-01 MED ORDER — ONDANSETRON HCL 4 MG/2ML IJ SOLN
INTRAMUSCULAR | Status: AC
  Filled 2016-02-01: qty 2

## 2016-02-01 MED ORDER — LIDOCAINE HCL (CARDIAC) 20 MG/ML IV SOLN
INTRAVENOUS | Status: DC | PRN
Start: 2016-02-01 — End: 2016-02-01
  Administered 2016-02-01: 50 mg via INTRAVENOUS

## 2016-02-01 MED ORDER — BUPIVACAINE-EPINEPHRINE (PF) 0.5% -1:200000 IJ SOLN
INTRAMUSCULAR | Status: DC | PRN
  Administered 2016-02-01: 30 mL

## 2016-02-01 SURGICAL SUPPLY — 58 items
ADH SKN CLS APL DERMABOND .7 (GAUZE/BANDAGES/DRESSINGS) ×1
BAG DECANTER FOR FLEXI CONT (MISCELLANEOUS) ×3 IMPLANT
BLADE SURG 10 STRL SS (BLADE) ×3 IMPLANT
BLADE SURG 15 STRL LF DISP TIS (BLADE) ×1 IMPLANT
BLADE SURG 15 STRL SS (BLADE) ×3
BLADE SURG ROTATE 9660 (MISCELLANEOUS) IMPLANT
CANISTER SUCTION 2500CC (MISCELLANEOUS) IMPLANT
CHLORAPREP W/TINT 26ML (MISCELLANEOUS) ×3 IMPLANT
CLEANER TIP ELECTROSURG 2X2 (MISCELLANEOUS) ×3 IMPLANT
CLOSURE WOUND 1/2 X4 (GAUZE/BANDAGES/DRESSINGS) ×1
COVER SURGICAL LIGHT HANDLE (MISCELLANEOUS) ×3 IMPLANT
DERMABOND ADVANCED (GAUZE/BANDAGES/DRESSINGS) ×2
DERMABOND ADVANCED .7 DNX12 (GAUZE/BANDAGES/DRESSINGS) ×1 IMPLANT
DRAIN PENROSE 1/2X12 LTX STRL (WOUND CARE) ×2 IMPLANT
DRAPE LAPAROTOMY TRNSV 102X78 (DRAPE) ×3 IMPLANT
DRAPE UTILITY XL STRL (DRAPES) ×3 IMPLANT
DRSG TEGADERM 4X4.75 (GAUZE/BANDAGES/DRESSINGS) ×3 IMPLANT
ELECT REM PT RETURN 9FT ADLT (ELECTROSURGICAL) ×3
ELECTRODE REM PT RTRN 9FT ADLT (ELECTROSURGICAL) ×1 IMPLANT
GAUZE SPONGE 4X4 16PLY XRAY LF (GAUZE/BANDAGES/DRESSINGS) ×3 IMPLANT
GLOVE BIO SURGEON STRL SZ7 (GLOVE) ×2 IMPLANT
GLOVE BIOGEL PI IND STRL 6 (GLOVE) IMPLANT
GLOVE BIOGEL PI IND STRL 6.5 (GLOVE) IMPLANT
GLOVE BIOGEL PI IND STRL 7.0 (GLOVE) IMPLANT
GLOVE BIOGEL PI IND STRL 8 (GLOVE) ×1 IMPLANT
GLOVE BIOGEL PI INDICATOR 6 (GLOVE) ×2
GLOVE BIOGEL PI INDICATOR 6.5 (GLOVE) ×2
GLOVE BIOGEL PI INDICATOR 7.0 (GLOVE) ×2
GLOVE BIOGEL PI INDICATOR 8 (GLOVE) ×2
GLOVE ECLIPSE 7.5 STRL STRAW (GLOVE) ×3 IMPLANT
GOWN STRL REUS W/ TWL LRG LVL3 (GOWN DISPOSABLE) ×2 IMPLANT
GOWN STRL REUS W/TWL LRG LVL3 (GOWN DISPOSABLE) ×9
KIT BASIN OR (CUSTOM PROCEDURE TRAY) ×3 IMPLANT
KIT ROOM TURNOVER OR (KITS) ×3 IMPLANT
MESH HERNIA 3X6 (Mesh General) ×2 IMPLANT
NDL HYPO 25GX1X1/2 BEV (NEEDLE) ×1 IMPLANT
NEEDLE HYPO 25GX1X1/2 BEV (NEEDLE) ×3 IMPLANT
NS IRRIG 1000ML POUR BTL (IV SOLUTION) ×3 IMPLANT
PACK SURGICAL SETUP 50X90 (CUSTOM PROCEDURE TRAY) ×3 IMPLANT
PAD ARMBOARD 7.5X6 YLW CONV (MISCELLANEOUS) ×3 IMPLANT
PENCIL BUTTON HOLSTER BLD 10FT (ELECTRODE) ×3 IMPLANT
SPECIMEN JAR SMALL (MISCELLANEOUS) IMPLANT
SPONGE INTESTINAL PEANUT (DISPOSABLE) ×3 IMPLANT
SPONGE LAP 18X18 X RAY DECT (DISPOSABLE) ×3 IMPLANT
STRIP CLOSURE SKIN 1/2X4 (GAUZE/BANDAGES/DRESSINGS) ×2 IMPLANT
SUT ETHIBOND 0 MO6 C/R (SUTURE) ×3 IMPLANT
SUT MON AB 4-0 PC3 18 (SUTURE) ×3 IMPLANT
SUT PROLENE 0 CT 2 (SUTURE) ×6 IMPLANT
SUT VIC AB 3-0 SH 27 (SUTURE) ×6
SUT VIC AB 3-0 SH 27X BRD (SUTURE) ×2 IMPLANT
SUT VICRYL AB 3 0 TIES (SUTURE) ×3 IMPLANT
SYR BULB 3OZ (MISCELLANEOUS) ×3 IMPLANT
SYR CONTROL 10ML LL (SYRINGE) ×3 IMPLANT
TOWEL OR 17X24 6PK STRL BLUE (TOWEL DISPOSABLE) ×3 IMPLANT
TOWEL OR 17X26 10 PK STRL BLUE (TOWEL DISPOSABLE) ×3 IMPLANT
TUBE CONNECTING 12'X1/4 (SUCTIONS) ×1
TUBE CONNECTING 12X1/4 (SUCTIONS) ×1 IMPLANT
YANKAUER SUCT BULB TIP NO VENT (SUCTIONS) ×2 IMPLANT

## 2016-02-01 NOTE — Transfer of Care (Signed)
Immediate Anesthesia Transfer of Care Note  Patient: Antonio Daniel  Procedure(s) Performed: Procedure(s): OPEN  RECURRENT HERNIA REPAIR LEFT  INGUINAL ADULT WITH MESH  (Left) INSERTION OF MESH (Left)  Patient Location: PACU  Anesthesia Type:General  Level of Consciousness: awake and sedated  Airway & Oxygen Therapy: Patient Spontanous Breathing and Patient connected to face mask oxygen  Post-op Assessment: Report given to RN and Post -op Vital signs reviewed and stable  Post vital signs: Reviewed and stable  Last Vitals:  Filed Vitals:   02/01/16 1150 02/01/16 1155  BP: 120/70 129/63  Pulse: 59 61  Temp:    Resp: 13 18    Last Pain: There were no vitals filed for this visit.       Complications: No apparent anesthesia complications

## 2016-02-01 NOTE — Op Note (Signed)
OPERATIVE REPORT  DATE OF OPERATION: 02/01/2016  PATIENT:  Antonio Daniel  65 y.o. male  PRE-OPERATIVE DIAGNOSIS:  Recurrent left inguinal hernia   POST-OPERATIVE DIAGNOSIS:  Recurrent left inguinal hernia, Direct with lipoma of the cord  FINDINGS:  Medial direct hernia at the area of the tubercle  PROCEDURE:  Procedure(s): OPEN  RECURRENT HERNIA REPAIR LEFT  INGUINAL ADULT WITH MESH  INSERTION OF MESH  SURGEON:  Surgeon(s): Judeth Horn, MD  ASSISTANT: None  ANESTHESIA:   general  COMPLICATIONS:  None  EBL: <20 ml  BLOOD ADMINISTERED: none  DRAINS: none   SPECIMEN:  No Specimen  COUNTS CORRECT:  YES  PROCEDURE DETAILS: The patient was taken to the operating room and placed on the table in supine position. After an adequate general endotracheal anesthetic was administered, he was prepped and draped in the usual sterile manner exposing the left inguinal area.  A proper timeout was performed identifying the patient and the procedure to be performed. The area from his previous hernia repair was identified. We marked the incision the made a diagonal incision through the previous scar using a #10 blade and taken down into the subcutaneous tissue. We made the dissection performed with electrocautery down to the external oblique fascia. Upon getting down to the level you could see the hernia protruding through the superficial ring.  We excise scar tissue attached to the external oblique fascia and then identified the superficial ring. We opened the superficial ring using electrocautery and identified the spermatic cord which is mobilize with a Penrose drain. The ilioinguinal nerve was also identified and dissected away from the field.  We opened the external oblique fascia and we could see that the hernia was coming off the medial aspect at the pubic tubercle and it was a direct type hernia. We also identified a lipoma of the cord that was on a stalk. This was resected using 0  Ethibond suture at the base.  The area of the direct hernia sac was dissected free in order to provide firm edges for repair. Once we done so we reduced the protruding hernia and pushed the sac back into the hole and then repaired using interrupted stitches of 0 Ethibond.  Once this was done we buttressed the area by placing an oval piece of polypropylene mesh measuring approximately 5 x 3 cm in size. We attached it to the pubic tubercle medially then the conjoined tendon anterior medially and the dissected out portion of the reflected portion of the inguinal ligament. This is sutured in place using 0 Prolene sutures. Once this was done we irrigated the wound with antibiotic solution which the mesh and been soaked prior to being implanted. We then reapproximated the external oblique fascia on top spermatic cord and the spared ilioinguinal nerve using running 3-0 Vicryl suture, taking care not to entrap the nerve. We then reapproximated Scarpa's fascia using interrupted 3-0 Vicryl. We injected 0.25% Marcaine without epinephrine into the subcutaneous tissue the mucosa skin using running subcuticular stitch of 4-0 Monocryl.  Dermabond, Steri-Strips, and Tegaderm used to complete the dressing. All needle counts, sponge counts, and instrument counts were correct.  PATIENT DISPOSITION:  PACU - hemodynamically stable.   Antonio Daniel 7/17/20171:58 PM

## 2016-02-01 NOTE — Anesthesia Procedure Notes (Addendum)
Anesthesia Regional Block:  TAP block  Pre-Anesthetic Checklist: ,, timeout performed, Correct Patient, Correct Site, Correct Laterality, Correct Procedure, Correct Position, site marked, Risks and benefits discussed,  Surgical consent,  Pre-op evaluation,  At surgeon's request and post-op pain management  Laterality: Left  Prep: chloraprep       Needles:  Injection technique: Single-shot  Needle Type: Echogenic Stimulator Needle     Needle Length: 10cm 10 cm Needle Gauge: 21 and 21 G    Additional Needles:  Procedures: ultrasound guided (picture in chart) TAP block Narrative:  Start time: 02/01/2016 11:25 AM End time: 02/01/2016 11:33 AM Injection made incrementally with aspirations every 5 mL.  Performed by: Personally    Procedure Name: Intubation Performed by: Terrance Mass Pre-anesthesia Checklist: Patient identified, Emergency Drugs available, Suction available and Patient being monitored Patient Re-evaluated:Patient Re-evaluated prior to inductionOxygen Delivery Method: Circle system utilized Preoxygenation: Pre-oxygenation with 100% oxygen Intubation Type: IV induction Ventilation: Mask ventilation without difficulty Tube type: Oral Tube size: 7.0 mm Number of attempts: 1 Airway Equipment and Method: Stylet and Oral airway Placement Confirmation: ETT inserted through vocal cords under direct vision,  positive ETCO2 and breath sounds checked- equal and bilateral Secured at: 23 cm Tube secured with: Tape Dental Injury: Teeth and Oropharynx as per pre-operative assessment

## 2016-02-01 NOTE — Discharge Instructions (Signed)
Open Hernia Repair, Care After Refer to this sheet in the next few weeks. These instructions provide you with information on caring for yourself after your procedure. Your health care provider may also give you more specific instructions. Your treatment has been planned according to current medical practices, but problems sometimes occur. Call your health care provider if you have any problems or questions after your procedure. WHAT TO EXPECT AFTER THE PROCEDURE After your procedure, it is typical to have the following:  Pain in your abdomen, especially along your incision. You will be given pain medicines to control the pain.  Constipation. You may be given a stool softener to help prevent this. HOME CARE INSTRUCTIONS  Only take over-the-counter or prescription medicines as directed by your health care provider.  Keep the incision area dry and clean. You may wash the incision area gently with soap and water 48 hours after surgery. Gently blot or dab the incision area dry. Do not take baths, use swimming pools, or use hot tubs for 10 days or until your health care provider approves.  Change bandages (dressings) as directed by your health care provider.  Do not remove the plastic dressing until seen in clinic  You may shower and pat the area dry gently.  Continue your normal diet as directed by your health care provider. Eat plenty of fruits and vegetables to help prevent constipation.  Drink enough fluids to keep your urine clear or pale yellow. This also helps prevent constipation.  Do not drive until your health care provider says it is okay.  Do not lift anything heavier than 10 lb (4.5 kg) or play contact sports for 4 weeks or until your health care provider approves.  Follow up with your health care provider as directed. Ask your health care provider when to make an appointment to have your stitches (sutures) or staples removed. SEEK MEDICAL CARE IF:  You have increased bleeding  coming from the incision site.  You have blood in your stool.  You have increasing pain in the incision area.  You see redness or swelling in the incision area.  You have fluid (pus) coming from the incision.  You have a fever.  You notice a bad smell coming from the incision area or dressing. SEEK IMMEDIATE MEDICAL CARE IF:  You develop a rash.  You have chest pain or shortness of breath.  You feel lightheaded or feel faint.   This information is not intended to replace advice given to you by your health care provider. Make sure you discuss any questions you have with your health care provider.

## 2016-02-01 NOTE — Anesthesia Preprocedure Evaluation (Signed)
Anesthesia Evaluation  Patient identified by MRN, date of birth, ID band Patient awake    Reviewed: Allergy & Precautions, H&P , NPO status , Patient's Chart, lab work & pertinent test results  History of Anesthesia Complications Negative for: history of anesthetic complications  Airway Mallampati: II  TM Distance: >3 FB Neck ROM: Full    Dental no notable dental hx. (+) Dental Advisory Given   Pulmonary neg pulmonary ROS,    Pulmonary exam normal breath sounds clear to auscultation       Cardiovascular negative cardio ROS Normal cardiovascular exam Rhythm:Regular Rate:Normal     Neuro/Psych negative neurological ROS  negative psych ROS   GI/Hepatic negative GI ROS, Neg liver ROS, hiatal hernia, GERD  Controlled,  Endo/Other  negative endocrine ROS  Renal/GU negative Renal ROS  negative genitourinary   Musculoskeletal negative musculoskeletal ROS (+)   Abdominal   Peds negative pediatric ROS (+)  Hematology negative hematology ROS (+)   Anesthesia Other Findings   Reproductive/Obstetrics negative OB ROS                             Anesthesia Physical  Anesthesia Plan  ASA: II  Anesthesia Plan: General   Post-op Pain Management: GA combined w/ Regional for post-op pain   Induction: Intravenous  Airway Management Planned: Oral ETT  Additional Equipment:   Intra-op Plan:   Post-operative Plan: Extubation in OR  Informed Consent: I have reviewed the patients History and Physical, chart, labs and discussed the procedure including the risks, benefits and alternatives for the proposed anesthesia with the patient or authorized representative who has indicated his/her understanding and acceptance.   Dental advisory given  Plan Discussed with: CRNA  Anesthesia Plan Comments:         Anesthesia Quick Evaluation

## 2016-02-02 ENCOUNTER — Encounter (HOSPITAL_COMMUNITY): Payer: Self-pay | Admitting: General Surgery

## 2016-02-02 NOTE — Anesthesia Postprocedure Evaluation (Signed)
Anesthesia Post Note  Patient: Antonio Daniel  Procedure(s) Performed: Procedure(s) (LRB): OPEN  RECURRENT HERNIA REPAIR LEFT  INGUINAL ADULT WITH MESH  (Left) INSERTION OF MESH (Left)  Patient location during evaluation: PACU Anesthesia Type: General Level of consciousness: sedated Pain management: pain level controlled Vital Signs Assessment: post-procedure vital signs reviewed and stable Respiratory status: spontaneous breathing and respiratory function stable Cardiovascular status: stable Anesthetic complications: no    Last Vitals:  Filed Vitals:   02/01/16 1549 02/01/16 1550  BP: 125/76   Pulse: 60   Temp:  36.3 C  Resp: 17     Last Pain:  Filed Vitals:   02/01/16 1641  PainSc: 0-No pain                 [name] DANIEL

## 2016-03-28 ENCOUNTER — Ambulatory Visit (HOSPITAL_COMMUNITY): Payer: 59

## 2016-03-30 ENCOUNTER — Encounter (HOSPITAL_COMMUNITY): Payer: Self-pay

## 2016-03-30 ENCOUNTER — Ambulatory Visit (HOSPITAL_COMMUNITY): Payer: 59 | Attending: Orthopedic Surgery

## 2016-03-30 DIAGNOSIS — R29898 Other symptoms and signs involving the musculoskeletal system: Secondary | ICD-10-CM | POA: Insufficient documentation

## 2016-03-30 DIAGNOSIS — M25511 Pain in right shoulder: Secondary | ICD-10-CM | POA: Insufficient documentation

## 2016-03-30 DIAGNOSIS — M25611 Stiffness of right shoulder, not elsewhere classified: Secondary | ICD-10-CM | POA: Diagnosis present

## 2016-03-30 NOTE — Patient Instructions (Addendum)
Complete these exercises laying down right now.  Complete 10-12 reps. 2-3 times.   1) Shoulder Protraction    Begin with elbows by your side, slowly "punch" straight out in front of you keeping arms/elbows straight.      2) Shoulder Flexion  Supine:     Standing:         Begin with arms at your side with thumbs pointed up, slowly raise both arms up and forward towards overhead.               3) Horizontal abduction/adduction  Supine:   Standing:           Begin with arms straight out in front of you, bring out to the side in at "T" shape. Keep arms straight entire time.                 4) Internal & External Rotation    *No band* -Stand with elbows at the side and elbows bent 90 degrees. Move your forearms away from your body, then bring back inward toward the body.     5) Shoulder Abduction  Supine:     Standing:       Lying on your back begin with your arms flat on the table next to your side. Slowly move your arms out to the side so that they go overhead, in a jumping jack or snow angel movement.
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
## 2016-04-05 ENCOUNTER — Ambulatory Visit: Payer: 59 | Admitting: Urology

## 2016-04-06 ENCOUNTER — Ambulatory Visit (HOSPITAL_COMMUNITY): Payer: 59 | Admitting: Occupational Therapy

## 2016-04-06 ENCOUNTER — Encounter (HOSPITAL_COMMUNITY): Payer: Self-pay | Admitting: Occupational Therapy

## 2016-04-06 DIAGNOSIS — M25511 Pain in right shoulder: Secondary | ICD-10-CM

## 2016-04-06 DIAGNOSIS — R29898 Other symptoms and signs involving the musculoskeletal system: Secondary | ICD-10-CM

## 2016-04-06 DIAGNOSIS — M25611 Stiffness of right shoulder, not elsewhere classified: Secondary | ICD-10-CM
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
## 2016-04-08 ENCOUNTER — Ambulatory Visit (HOSPITAL_COMMUNITY): Payer: 59

## 2016-04-08 DIAGNOSIS — M25611 Stiffness of right shoulder, not elsewhere classified: Secondary | ICD-10-CM

## 2016-04-08 DIAGNOSIS — R29898 Other symptoms and signs involving the musculoskeletal system: Secondary | ICD-10-CM

## 2016-04-08 DIAGNOSIS — M25511 Pain in right shoulder: Secondary | ICD-10-CM | POA: Diagnosis not present

## 2016-04-08 NOTE — Patient Instructions (Signed)
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
## 2016-04-13 ENCOUNTER — Ambulatory Visit (HOSPITAL_COMMUNITY): Payer: 59

## 2016-04-13 ENCOUNTER — Encounter (HOSPITAL_COMMUNITY): Payer: Self-pay

## 2016-04-13 ENCOUNTER — Encounter (HOSPITAL_COMMUNITY): Payer: 59

## 2016-04-13 DIAGNOSIS — R29898 Other symptoms and signs involving the musculoskeletal system: Secondary | ICD-10-CM

## 2016-04-13 DIAGNOSIS — M25611 Stiffness of right shoulder, not elsewhere classified: Secondary | ICD-10-CM

## 2016-04-13 DIAGNOSIS — M25511 Pain in right shoulder: Secondary | ICD-10-CM | POA: Diagnosis not present
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
## 2016-04-15 ENCOUNTER — Encounter (HOSPITAL_COMMUNITY): Payer: Self-pay

## 2016-04-15 ENCOUNTER — Encounter (HOSPITAL_COMMUNITY): Payer: 59

## 2016-04-15 ENCOUNTER — Ambulatory Visit (HOSPITAL_COMMUNITY): Payer: 59

## 2016-04-15 DIAGNOSIS — R29898 Other symptoms and signs involving the musculoskeletal system: Secondary | ICD-10-CM

## 2016-04-15 DIAGNOSIS — M25611 Stiffness of right shoulder, not elsewhere classified: Secondary | ICD-10-CM

## 2016-04-15 DIAGNOSIS — M25511 Pain in right shoulder: Secondary | ICD-10-CM | POA: Diagnosis not present
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
## 2016-04-15 NOTE — Patient Instructions (Signed)
Strengthening: Chest Pull - Resisted   Hold Theraband in front of body with hands about shoulder width a part. Pull band a part and back together slowly. Repeat _10-15___ times. Complete ____ set(s) per session.. Repeat ____ session(s) per day.  http://orth.exer.us/926   Copyright  VHI. All rights reserved.   PNF Strengthening: Resisted   Standing with resistive band around each hand, bring right arm up and away, thumb back. Repeat _10-15___ times per set. Do ____ sets per session. Do ____ sessions per day.      Resisted External Rotation: in Neutral - Bilateral   Sit or stand, tubing in both hands, elbows at sides, bent to 90, forearms forward. Pinch shoulder blades together and rotate forearms out. Keep elbows at sides. Repeat __10-15__ times per set. Do ____ sets per session. Do ____ sessions per day.  http://orth.exer.us/966   Copyright  VHI. All rights reserved.   PNF Strengthening: Resisted   Standing, hold resistive band above head. Bring right arm down and out from side. Repeat _10-15___ times per set. Do ____ sets per session. Do ____ sessions per day.  http://orth.exer.us/922   Copyright  VHI. All rights reserved.  

## 2016-04-19 ENCOUNTER — Encounter (HOSPITAL_COMMUNITY): Payer: 59 | Admitting: Occupational Therapy

## 2016-04-21 ENCOUNTER — Encounter (HOSPITAL_COMMUNITY): Payer: 59 | Admitting: Occupational Therapy

## 2016-04-26 ENCOUNTER — Encounter (HOSPITAL_COMMUNITY): Payer: 59

## 2016-04-28 ENCOUNTER — Encounter (HOSPITAL_COMMUNITY): Payer: 59

## 2016-05-03 ENCOUNTER — Ambulatory Visit (HOSPITAL_COMMUNITY): Payer: 59 | Attending: Orthopedic Surgery

## 2016-05-03 ENCOUNTER — Telehealth (HOSPITAL_COMMUNITY): Payer: Self-pay

## 2016-05-03 NOTE — Telephone Encounter (Signed)
Called patient regarding no show. Patient stated his appointments were cancelled and that he was not to return for 2 weeks. Patient was informed that it has been two weeks and he was given an updated schedule at his last appointment. Patient states that "you need to call me to remind me." Informed patient that we don't typically call patients to remind them of their future appointments and informed him of when his next appointment is this week. Asked patient to call if he is unable to make it.   Ailene Ravel, OTR/L,CBIS  [phone]

## 2016-05-04 ENCOUNTER — Telehealth (HOSPITAL_COMMUNITY): Payer: Self-pay

## 2016-05-04 NOTE — Telephone Encounter (Signed)
Can you cancel all  apt TAHJE SAVALA Jun 07, 1951. He is due for another shoulder surgery in a few weeks he will contact you when he needs additional therapy . By Email. NF

## 2016-05-05 ENCOUNTER — Ambulatory Visit (HOSPITAL_COMMUNITY): Payer: 59 | Admitting: Occupational Therapy

## 2016-05-10 ENCOUNTER — Ambulatory Visit (HOSPITAL_COMMUNITY): Payer: 59

## 2016-05-12 ENCOUNTER — Encounter (HOSPITAL_COMMUNITY): Payer: 59

## 2016-05-17 ENCOUNTER — Ambulatory Visit (INDEPENDENT_AMBULATORY_CARE_PROVIDER_SITE_OTHER): Payer: 59 | Admitting: Urology

## 2016-05-17 ENCOUNTER — Encounter (HOSPITAL_COMMUNITY): Payer: 59 | Admitting: Occupational Therapy

## 2016-05-17 DIAGNOSIS — R972 Elevated prostate specific antigen [PSA]: Secondary | ICD-10-CM

## 2016-05-19 ENCOUNTER — Encounter (HOSPITAL_COMMUNITY): Payer: 59

## 2016-05-24 ENCOUNTER — Encounter (HOSPITAL_COMMUNITY): Payer: 59

## 2016-05-26 ENCOUNTER — Encounter (HOSPITAL_COMMUNITY): Payer: 59

## 2016-05-27 ENCOUNTER — Ambulatory Visit: Payer: 59 | Admitting: Gastroenterology

## 2016-05-30 ENCOUNTER — Ambulatory Visit: Payer: 59 | Admitting: Gastroenterology

## 2016-05-30 ENCOUNTER — Encounter: Payer: Self-pay | Admitting: Gastroenterology

## 2016-05-30 ENCOUNTER — Telehealth: Payer: Self-pay | Admitting: Gastroenterology

## 2016-05-30 NOTE — Telephone Encounter (Signed)
PT WAS A NO SHOW AND LETTER SENT  °

## 2016-07-28 ENCOUNTER — Telehealth (HOSPITAL_COMMUNITY): Payer: Self-pay | Admitting: Occupational Therapy

## 2016-07-28 NOTE — Telephone Encounter (Signed)
Patient wants to hold off on rehab for now.

## 2016-07-29 ENCOUNTER — Ambulatory Visit (HOSPITAL_COMMUNITY): Payer: 59 | Attending: Orthopedic Surgery | Admitting: Specialist

## 2016-07-29 ENCOUNTER — Ambulatory Visit (HOSPITAL_COMMUNITY): Payer: 59 | Admitting: Occupational Therapy

## 2016-07-29 DIAGNOSIS — M25612 Stiffness of left shoulder, not elsewhere classified: Secondary | ICD-10-CM | POA: Insufficient documentation
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
## 2016-07-29 NOTE — Patient Instructions (Signed)
Doorway Stretch  Place each hand opposite each other on the doorway. (You can change where you feel the stretch by moving arms higher or lower.) Step through with one foot and bend front knee until a stretch is felt and hold. Step through with the opposite foot on the next rep. Hold for _____ seconds. Repeat ____times.     Scapular Retraction (Standing)   With arms at sides, pinch shoulder blades together. Repeat ____ times per set. Do ____ sets per session. Do ____ sessions per day.  http://orth.exer.us/944   Copyright  VHI. All rights reserved.   Internal Rotation Across Back  Grab the end of a towel with your affected side, palm facing backwards. Grab the towel with your unaffected side and pull your affected hand across your back until you feel a stretch in the front of your shoulder. If you feel pain, pull just to the pain, do not pull through the pain. Hold. Return your affected arm to your side. Try to keep your hand/arm close to your body during the entire movement.            Posterior Capsule Stretch   Stand or sit, one arm across body so hand rests over opposite shoulder. Gently push on crossed elbow with other hand until stretch is felt in shoulder of crossed arm. Hold ___ seconds.  Repeat ___ times per session. Do ___ sessions per day.   Wall Flexion  Using a towel, slide your arm up the wall until a stretch is felt in your shoulder .   (Home) Extension: Isometric / Bilateral Arm Retraction - Sitting   Facing anchor, hold hands and elbow at shoulder height, with elbow bent.  Pull arms back to squeeze shoulder blades together. Repeat 10-15 times.  Copyright  VHI. All rights reserved.   (Home) Retraction: Row - Bilateral (Anchor)   Facing anchor, arms reaching forward, pull hands toward stomach, keeping elbows bent and at your sides and pinching shoulder blades together. Repeat 10-15 times.  Copyright  VHI. All rights reserved.   (Clinic) Extension /  Flexion (Assist)   Face anchor, pull arms back, keeping elbow straight, and squeze shoulder blades together. Repeat 10-15 times.   Copyright  VHI. All rights reserved.  External Rotation: Single Arm (Cable)    Arm across body, rotate arm away from torso, keeping upper arm against body. Do ____ sets. Complete ____ repetitions.  http://st.exer.us/534   Copyright  VHI. All rights reserved.  Strengthening: Resisted Internal Rotation    Hold tubing in left hand, elbow at side and forearm out. Rotate forearm in across body. Repeat ____ times per set. Do ____ sets per session. Do ____ sessions per day.  http://orth.exer.us/830   Copyright  VHI. All rights reserved.

## 2016-08-03 ENCOUNTER — Encounter (HOSPITAL_COMMUNITY): Payer: 59 | Admitting: Specialist

## 2016-08-10 ENCOUNTER — Encounter (HOSPITAL_COMMUNITY): Payer: 59

## 2016-08-17 ENCOUNTER — Encounter (HOSPITAL_COMMUNITY): Payer: 59 | Admitting: Occupational Therapy

## 2016-08-24 ENCOUNTER — Encounter (HOSPITAL_COMMUNITY): Payer: 59

## 2016-08-31 ENCOUNTER — Encounter (HOSPITAL_COMMUNITY): Payer: 59

## 2016-09-07 ENCOUNTER — Encounter (HOSPITAL_COMMUNITY): Payer: 59

## 2016-09-14 ENCOUNTER — Encounter (HOSPITAL_COMMUNITY): Payer: 59 | Admitting: Occupational Therapy

## 2016-09-27 DIAGNOSIS — M7542 Impingement syndrome of left shoulder: Secondary | ICD-10-CM | POA: Diagnosis not present

## 2016-09-27 DIAGNOSIS — M19012 Primary osteoarthritis, left shoulder: Secondary | ICD-10-CM | POA: Diagnosis not present

## 2016-09-27 DIAGNOSIS — M75122 Complete rotator cuff tear or rupture of left shoulder, not specified as traumatic: Secondary | ICD-10-CM | POA: Diagnosis not present

## 2016-10-07 DIAGNOSIS — M542 Cervicalgia: Secondary | ICD-10-CM | POA: Diagnosis not present

## 2016-10-07 DIAGNOSIS — M25519 Pain in unspecified shoulder: Secondary | ICD-10-CM | POA: Diagnosis not present

## 2016-10-07 DIAGNOSIS — M5030 Other cervical disc degeneration, unspecified cervical region: Secondary | ICD-10-CM | POA: Diagnosis not present

## 2016-10-07 DIAGNOSIS — M5136 Other intervertebral disc degeneration, lumbar region: Secondary | ICD-10-CM | POA: Diagnosis not present

## 2016-10-07 DIAGNOSIS — M25569 Pain in unspecified knee: Secondary | ICD-10-CM | POA: Diagnosis not present

## 2016-12-28 DIAGNOSIS — M75121 Complete rotator cuff tear or rupture of right shoulder, not specified as traumatic: Secondary | ICD-10-CM | POA: Diagnosis not present

## 2016-12-28 DIAGNOSIS — Z4789 Encounter for other orthopedic aftercare: Secondary | ICD-10-CM | POA: Diagnosis not present

## 2017-01-05 MED FILL — IBUPROFEN 800 MG TABLET: 800 | 90 days supply | Qty: 270 | Fill #0

## 2017-02-20 DIAGNOSIS — H524 Presbyopia: Secondary | ICD-10-CM | POA: Diagnosis not present

## 2017-02-20 DIAGNOSIS — H5203 Hypermetropia, bilateral: Secondary | ICD-10-CM | POA: Diagnosis not present

## 2017-04-04 ENCOUNTER — Telehealth: Payer: Self-pay

## 2017-04-04 NOTE — Telephone Encounter (Signed)
Received a fax from the [facility], pt is transferring his rx's there. Pt needs rx for Aciphex 20mg  sent in.  Pt was given rx at his last procedure in May 2017.   Routing to the refill box.

## 2017-04-05 MED ORDER — RABEPRAZOLE SODIUM 20 MG PO TBEC: 20.0000 mg | DELAYED_RELEASE_TABLET | Freq: Every day | ORAL | 3 refills | Status: DC

## 2017-04-05 MED FILL — RABEPRAZOLE SOD DR 20 MG TA: 20 | 90 days supply | Qty: 90 | Fill #0

## 2017-04-05 NOTE — Telephone Encounter (Signed)
Done

## 2017-05-02 ENCOUNTER — Other Ambulatory Visit (HOSPITAL_COMMUNITY)
Admission: RE | Admit: 2017-05-02 | Discharge: 2017-05-02 | Disposition: A | Discharge status: Home / Self Care | Payer: 59 | Source: Ambulatory Visit | Attending: Physician Assistant | Admitting: Physician Assistant

## 2017-05-02 DIAGNOSIS — R7301 Impaired fasting glucose: Secondary | ICD-10-CM | POA: Diagnosis not present

## 2017-05-02 DIAGNOSIS — Z0000 Encounter for general adult medical examination without abnormal findings: Secondary | ICD-10-CM | POA: Diagnosis not present

## 2017-05-02 DIAGNOSIS — E663 Overweight: Secondary | ICD-10-CM | POA: Insufficient documentation

## 2017-05-02 DIAGNOSIS — R7309 Other abnormal glucose: Secondary | ICD-10-CM | POA: Diagnosis not present

## 2017-05-02 DIAGNOSIS — Z6826 Body mass index (BMI) 26.0-26.9, adult: Secondary | ICD-10-CM | POA: Diagnosis not present

## 2017-05-02 DIAGNOSIS — Z1389 Encounter for screening for other disorder: Secondary | ICD-10-CM | POA: Diagnosis not present

## 2017-05-02 LAB — COMPREHENSIVE METABOLIC PANEL
ALT: 23 U/L (ref 17–63)
ANION GAP: 9 (ref 5–15)
AST: 21 U/L (ref 15–41)
Albumin: 4.1 g/dL (ref 3.5–5.0)
Alkaline Phosphatase: 69 U/L (ref 38–126)
BILIRUBIN TOTAL: 1 mg/dL (ref 0.3–1.2)
BUN: 17 mg/dL (ref 6–20)
CHLORIDE: 101 mmol/L (ref 101–111)
CO2: 27 mmol/L (ref 22–32)
Calcium: 9.1 mg/dL (ref 8.9–10.3)
Creatinine, Ser: 0.89 mg/dL (ref 0.61–1.24)
Glucose, Bld: 102 mg/dL — ABNORMAL HIGH (ref 65–99)
POTASSIUM: 4 mmol/L (ref 3.5–5.1)
Sodium: 137 mmol/L (ref 135–145)
Total Protein: 7.1 g/dL (ref 6.5–8.1)

## 2017-05-02 LAB — CBC WITH DIFFERENTIAL/PLATELET
BASOS ABS: 0 10*3/uL (ref 0.0–0.1)
BASOS PCT: 1 %
EOS ABS: 0.3 10*3/uL (ref 0.0–0.7)
EOS PCT: 4 %
HEMATOCRIT: 44.3 % (ref 39.0–52.0)
Hemoglobin: 14.9 g/dL (ref 13.0–17.0)
Lymphocytes Relative: 20 %
Lymphs Abs: 1.4 10*3/uL (ref 0.7–4.0)
MCH: 30.3 pg (ref 26.0–34.0)
MCHC: 33.6 g/dL (ref 30.0–36.0)
MCV: 90 fL (ref 78.0–100.0)
MONO ABS: 0.6 10*3/uL (ref 0.1–1.0)
Monocytes Relative: 8 %
NEUTROS ABS: 4.6 10*3/uL (ref 1.7–7.7)
Neutrophils Relative %: 67 %
PLATELETS: 192 10*3/uL (ref 150–400)
RBC: 4.92 MIL/uL (ref 4.22–5.81)
RDW: 13.7 % (ref 11.5–15.5)
WBC: 6.9 10*3/uL (ref 4.0–10.5)

## 2017-05-02 LAB — LIPID PANEL
CHOL/HDL RATIO: 5.9 ratio
CHOLESTEROL: 212 mg/dL — AB (ref 0–200)
HDL: 36 mg/dL — ABNORMAL LOW (ref >40)
LDL Cholesterol: 152 mg/dL — ABNORMAL HIGH (ref 0–99)
TRIGLYCERIDES: 121 mg/dL (ref <150)
VLDL: 24 mg/dL (ref 0–40)

## 2017-05-02 LAB — HEMOGLOBIN A1C
HEMOGLOBIN A1C: 5.6 % (ref 4.8–5.6)
Mean Plasma Glucose: 114.02 mg/dL

## 2017-05-02 LAB — PSA: Prostatic Specific Antigen: 0.69 ng/mL (ref 0.00–4.00)

## 2017-05-03 MED FILL — CLOBETASOL 0.05% CREAM: 0.05 | 30 days supply | Qty: 60 | Fill #0

## 2017-05-26 MED FILL — IBUPROFEN 800 MG TABS: 800 | 90 days supply | Qty: 270 | Fill #1

## 2017-07-12 MED FILL — RABEPRAZOLE SOD DR 20 MG TA: 20 | 90 days supply | Qty: 90 | Fill #1

## 2017-10-11 DIAGNOSIS — S46011D Strain of muscle(s) and tendon(s) of the rotator cuff of right shoulder, subsequent encounter: Secondary | ICD-10-CM | POA: Diagnosis not present

## 2017-10-11 DIAGNOSIS — M25511 Pain in right shoulder: Secondary | ICD-10-CM | POA: Diagnosis not present

## 2017-10-23 MED FILL — IBUPROFEN 800 MG TAB: 800 | 30 days supply | Qty: 90 | Fill #0

## 2017-10-30 MED FILL — RABEPRAZOLE SOD DR 20 MG TA: 20 | 90 days supply | Qty: 90 | Fill #2

## 2017-12-29 MED FILL — IBUPROFEN 800 MG TAB: 800 | 30 days supply | Qty: 90 | Fill #1

## 2018-01-03 DIAGNOSIS — Z6825 Body mass index (BMI) 25.0-25.9, adult: Secondary | ICD-10-CM | POA: Diagnosis not present

## 2018-01-03 DIAGNOSIS — Z0000 Encounter for general adult medical examination without abnormal findings: Secondary | ICD-10-CM | POA: Diagnosis not present

## 2018-01-03 DIAGNOSIS — K219 Gastro-esophageal reflux disease without esophagitis: Secondary | ICD-10-CM | POA: Diagnosis not present

## 2018-01-03 DIAGNOSIS — M159 Polyosteoarthritis, unspecified: Secondary | ICD-10-CM | POA: Diagnosis not present

## 2018-01-03 DIAGNOSIS — Z1389 Encounter for screening for other disorder: Secondary | ICD-10-CM | POA: Diagnosis not present

## 2018-01-10 MED FILL — RABEPRAZOLE SOD DR 20 MG TA: 20 | 90 days supply | Qty: 90 | Fill #3

## 2018-03-15 DIAGNOSIS — H524 Presbyopia: Secondary | ICD-10-CM | POA: Diagnosis not present

## 2018-03-15 DIAGNOSIS — H52223 Regular astigmatism, bilateral: Secondary | ICD-10-CM | POA: Diagnosis not present

## 2018-03-22 DIAGNOSIS — M4712 Other spondylosis with myelopathy, cervical region: Secondary | ICD-10-CM | POA: Diagnosis not present

## 2018-03-23 MED FILL — ALLOPURINOL 300 MG TABS: 300 | 90 days supply | Qty: 90 | Fill #0

## 2018-04-16 MED FILL — IBUPROFEN 800 MG TAB: 800 | 90 days supply | Qty: 270 | Fill #0 | Status: TO

## 2018-04-26 ENCOUNTER — Other Ambulatory Visit (HOSPITAL_COMMUNITY): Payer: Self-pay | Admitting: Neurosurgery

## 2018-04-26 DIAGNOSIS — M4712 Other spondylosis with myelopathy, cervical region: Secondary | ICD-10-CM

## 2018-04-30 ENCOUNTER — Other Ambulatory Visit (HOSPITAL_COMMUNITY): Payer: Self-pay | Admitting: Neurosurgery

## 2018-04-30 DIAGNOSIS — M4712 Other spondylosis with myelopathy, cervical region: Secondary | ICD-10-CM

## 2018-05-01 ENCOUNTER — Ambulatory Visit (HOSPITAL_COMMUNITY)
Admission: RE | Admit: 2018-05-01 | Discharge: 2018-05-01 | Disposition: A | Discharge status: Home / Self Care | Payer: PPO | Source: Ambulatory Visit | Attending: Neurosurgery | Admitting: Neurosurgery

## 2018-05-01 DIAGNOSIS — M5032 Other cervical disc degeneration, mid-cervical region, unspecified level: Secondary | ICD-10-CM | POA: Diagnosis not present

## 2018-05-01 DIAGNOSIS — M4712 Other spondylosis with myelopathy, cervical region: Secondary | ICD-10-CM | POA: Diagnosis not present

## 2018-05-01 DIAGNOSIS — M5030 Other cervical disc degeneration, unspecified cervical region: Secondary | ICD-10-CM | POA: Diagnosis not present

## 2018-05-24 DIAGNOSIS — M4712 Other spondylosis with myelopathy, cervical region: Secondary | ICD-10-CM | POA: Diagnosis not present

## 2018-06-06 DIAGNOSIS — M9905 Segmental and somatic dysfunction of pelvic region: Secondary | ICD-10-CM | POA: Diagnosis not present

## 2018-06-06 DIAGNOSIS — M25562 Pain in left knee: Secondary | ICD-10-CM | POA: Diagnosis not present

## 2018-06-11 DIAGNOSIS — D225 Melanocytic nevi of trunk: Secondary | ICD-10-CM | POA: Diagnosis not present

## 2018-06-11 DIAGNOSIS — L820 Inflamed seborrheic keratosis: Secondary | ICD-10-CM | POA: Diagnosis not present

## 2018-06-13 DIAGNOSIS — M25562 Pain in left knee: Secondary | ICD-10-CM | POA: Diagnosis not present

## 2018-06-13 DIAGNOSIS — M9905 Segmental and somatic dysfunction of pelvic region: Secondary | ICD-10-CM | POA: Diagnosis not present

## 2018-06-18 DIAGNOSIS — M25562 Pain in left knee: Secondary | ICD-10-CM | POA: Diagnosis not present

## 2018-06-18 DIAGNOSIS — M9905 Segmental and somatic dysfunction of pelvic region: Secondary | ICD-10-CM | POA: Diagnosis not present

## 2018-06-22 DIAGNOSIS — M9905 Segmental and somatic dysfunction of pelvic region: Secondary | ICD-10-CM | POA: Diagnosis not present

## 2018-06-22 DIAGNOSIS — M25562 Pain in left knee: Secondary | ICD-10-CM | POA: Diagnosis not present

## 2018-06-25 DIAGNOSIS — M25562 Pain in left knee: Secondary | ICD-10-CM | POA: Diagnosis not present

## 2018-06-25 DIAGNOSIS — M9905 Segmental and somatic dysfunction of pelvic region: Secondary | ICD-10-CM | POA: Diagnosis not present

## 2018-06-29 DIAGNOSIS — M9905 Segmental and somatic dysfunction of pelvic region: Secondary | ICD-10-CM | POA: Diagnosis not present

## 2018-06-29 DIAGNOSIS — M25562 Pain in left knee: Secondary | ICD-10-CM | POA: Diagnosis not present

## 2018-07-04 DIAGNOSIS — M9905 Segmental and somatic dysfunction of pelvic region: Secondary | ICD-10-CM | POA: Diagnosis not present

## 2018-07-04 DIAGNOSIS — M25562 Pain in left knee: Secondary | ICD-10-CM | POA: Diagnosis not present

## 2018-07-06 DIAGNOSIS — M25562 Pain in left knee: Secondary | ICD-10-CM | POA: Diagnosis not present

## 2018-07-06 DIAGNOSIS — M9905 Segmental and somatic dysfunction of pelvic region: Secondary | ICD-10-CM | POA: Diagnosis not present

## 2018-07-10 DIAGNOSIS — M65312 Trigger thumb, left thumb: Secondary | ICD-10-CM | POA: Diagnosis not present

## 2018-08-31 DIAGNOSIS — Z471 Aftercare following joint replacement surgery: Secondary | ICD-10-CM | POA: Diagnosis not present

## 2018-08-31 DIAGNOSIS — M25562 Pain in left knee: Secondary | ICD-10-CM | POA: Diagnosis not present

## 2018-08-31 DIAGNOSIS — M1711 Unilateral primary osteoarthritis, right knee: Secondary | ICD-10-CM | POA: Diagnosis not present

## 2018-08-31 DIAGNOSIS — Z96651 Presence of right artificial knee joint: Secondary | ICD-10-CM | POA: Diagnosis not present

## 2018-08-31 DIAGNOSIS — M25552 Pain in left hip: Secondary | ICD-10-CM | POA: Diagnosis not present

## 2018-08-31 DIAGNOSIS — M170 Bilateral primary osteoarthritis of knee: Secondary | ICD-10-CM | POA: Diagnosis not present

## 2018-08-31 DIAGNOSIS — M1712 Unilateral primary osteoarthritis, left knee: Secondary | ICD-10-CM | POA: Diagnosis not present

## 2018-09-19 DIAGNOSIS — M25552 Pain in left hip: Secondary | ICD-10-CM | POA: Diagnosis not present

## 2018-09-25 DIAGNOSIS — Z6827 Body mass index (BMI) 27.0-27.9, adult: Secondary | ICD-10-CM | POA: Diagnosis not present

## 2018-09-25 DIAGNOSIS — Z0000 Encounter for general adult medical examination without abnormal findings: Secondary | ICD-10-CM | POA: Diagnosis not present

## 2018-09-25 DIAGNOSIS — E663 Overweight: Secondary | ICD-10-CM | POA: Diagnosis not present

## 2018-09-25 DIAGNOSIS — N400 Enlarged prostate without lower urinary tract symptoms: Secondary | ICD-10-CM | POA: Diagnosis not present

## 2018-09-25 DIAGNOSIS — Z0001 Encounter for general adult medical examination with abnormal findings: Secondary | ICD-10-CM | POA: Diagnosis not present

## 2018-09-25 DIAGNOSIS — Z23 Encounter for immunization: Secondary | ICD-10-CM | POA: Diagnosis not present

## 2018-09-25 DIAGNOSIS — R5383 Other fatigue: Secondary | ICD-10-CM | POA: Diagnosis not present

## 2019-03-20 DIAGNOSIS — H2513 Age-related nuclear cataract, bilateral: Secondary | ICD-10-CM | POA: Diagnosis not present

## 2019-03-20 DIAGNOSIS — H524 Presbyopia: Secondary | ICD-10-CM | POA: Diagnosis not present

## 2019-03-20 DIAGNOSIS — H52223 Regular astigmatism, bilateral: Secondary | ICD-10-CM | POA: Diagnosis not present

## 2019-03-27 DIAGNOSIS — M47812 Spondylosis without myelopathy or radiculopathy, cervical region: Secondary | ICD-10-CM | POA: Diagnosis not present

## 2019-03-27 DIAGNOSIS — M47816 Spondylosis without myelopathy or radiculopathy, lumbar region: Secondary | ICD-10-CM | POA: Diagnosis not present

## 2019-03-28 ENCOUNTER — Other Ambulatory Visit: Payer: Self-pay | Admitting: Neurological Surgery

## 2019-03-28 ENCOUNTER — Other Ambulatory Visit (HOSPITAL_COMMUNITY): Payer: Self-pay | Admitting: Neurological Surgery

## 2019-03-28 DIAGNOSIS — M47816 Spondylosis without myelopathy or radiculopathy, lumbar region: Secondary | ICD-10-CM

## 2019-04-04 ENCOUNTER — Ambulatory Visit (HOSPITAL_COMMUNITY)
Admission: RE | Admit: 2019-04-04 | Discharge: 2019-04-04 | Disposition: A | Discharge status: Home / Self Care | Payer: PPO | Source: Ambulatory Visit | Attending: Neurological Surgery | Admitting: Neurological Surgery

## 2019-04-04 ENCOUNTER — Other Ambulatory Visit: Payer: Self-pay

## 2019-04-04 DIAGNOSIS — M47816 Spondylosis without myelopathy or radiculopathy, lumbar region: Secondary | ICD-10-CM | POA: Insufficient documentation

## 2019-04-04 DIAGNOSIS — M5126 Other intervertebral disc displacement, lumbar region: Secondary | ICD-10-CM | POA: Diagnosis not present

## 2019-04-04 DIAGNOSIS — M5136 Other intervertebral disc degeneration, lumbar region: Secondary | ICD-10-CM | POA: Diagnosis not present

## 2019-04-22 DIAGNOSIS — M47816 Spondylosis without myelopathy or radiculopathy, lumbar region: Secondary | ICD-10-CM | POA: Diagnosis not present

## 2019-10-01 DIAGNOSIS — M159 Polyosteoarthritis, unspecified: Secondary | ICD-10-CM | POA: Diagnosis not present

## 2019-10-01 DIAGNOSIS — Z6826 Body mass index (BMI) 26.0-26.9, adult: Secondary | ICD-10-CM | POA: Diagnosis not present

## 2019-10-01 DIAGNOSIS — Z0000 Encounter for general adult medical examination without abnormal findings: Secondary | ICD-10-CM | POA: Diagnosis not present

## 2019-10-01 DIAGNOSIS — K219 Gastro-esophageal reflux disease without esophagitis: Secondary | ICD-10-CM | POA: Diagnosis not present

## 2019-10-01 DIAGNOSIS — Z1389 Encounter for screening for other disorder: Secondary | ICD-10-CM | POA: Diagnosis not present

## 2019-10-01 DIAGNOSIS — M109 Gout, unspecified: Secondary | ICD-10-CM | POA: Diagnosis not present

## 2019-10-13 IMAGING — MR MR LUMBAR SPINE W/O CM
4 of 5 series · 13 of 48 positions shown · non-contrast
Comparison: MRI dated 01/29/2013

CLINICAL DATA: Low back and bilateral leg pain.

EXAM:
MRI LUMBAR SPINE WITHOUT CONTRAST
TECHNIQUE: Multiplanar, multisequence MR imaging of the lumbar spine was
performed. No intravenous contrast was administered.

[Series 3: T2 · sagittal · 4.0mm · 0.43mm/px · 4 of 15 slices shown (1 of 2)]
[im 1/15]
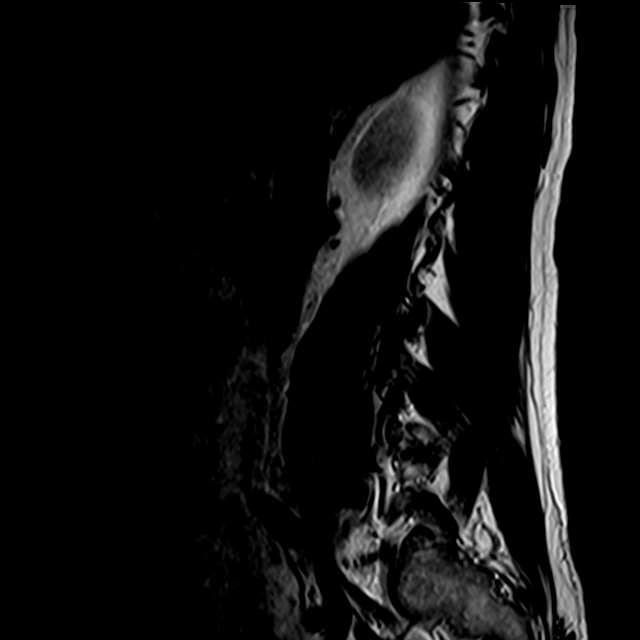
[im 4/15]
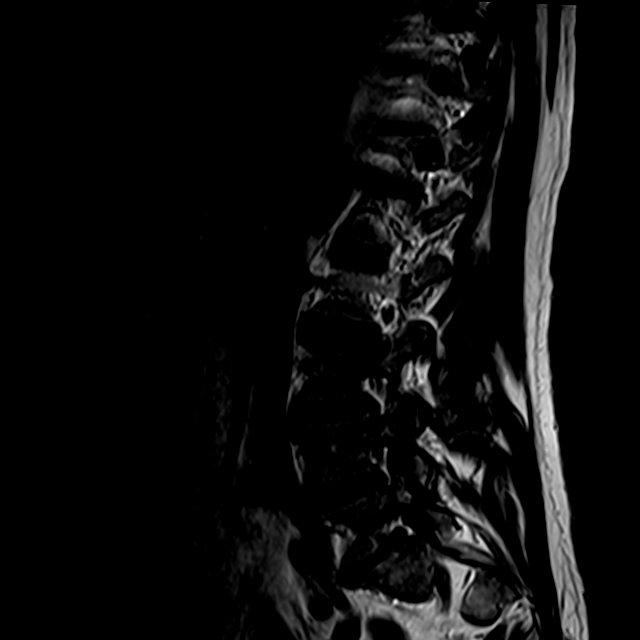
[im 8/15]
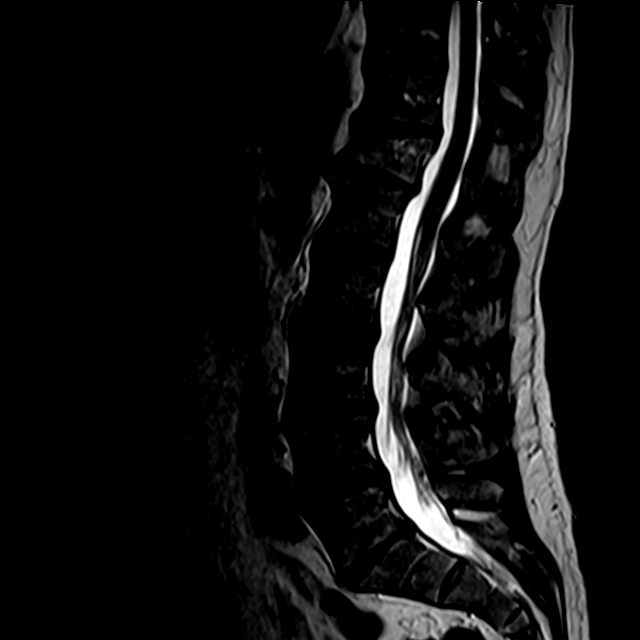
[im 15/15]
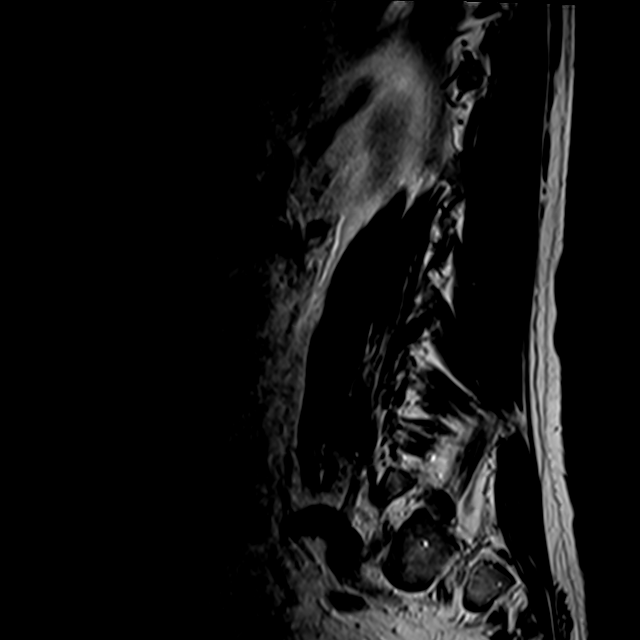

[Series 4: T1 · sagittal · 4.0mm · 0.43mm/px · 3 of 15 slices shown (1 of 2)]
[im 1/15]
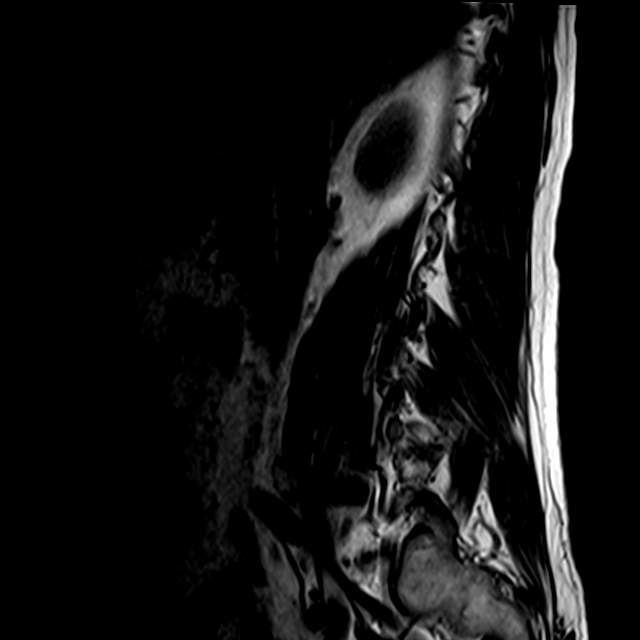
[im 8/15]
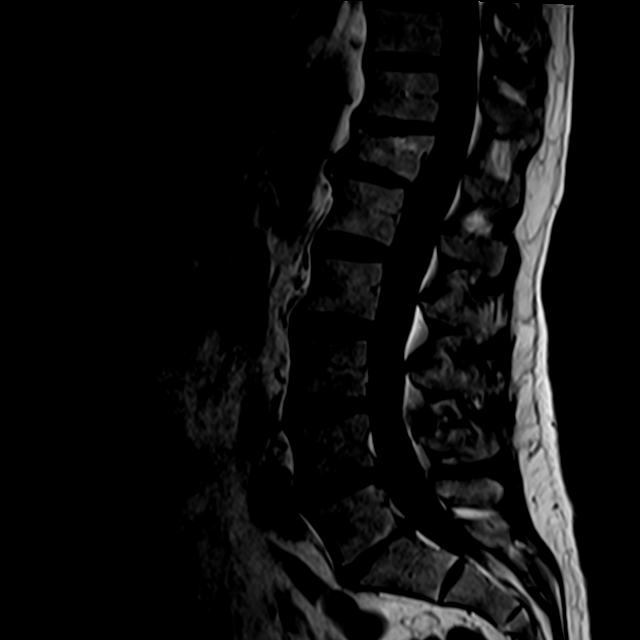
[im 15/15]
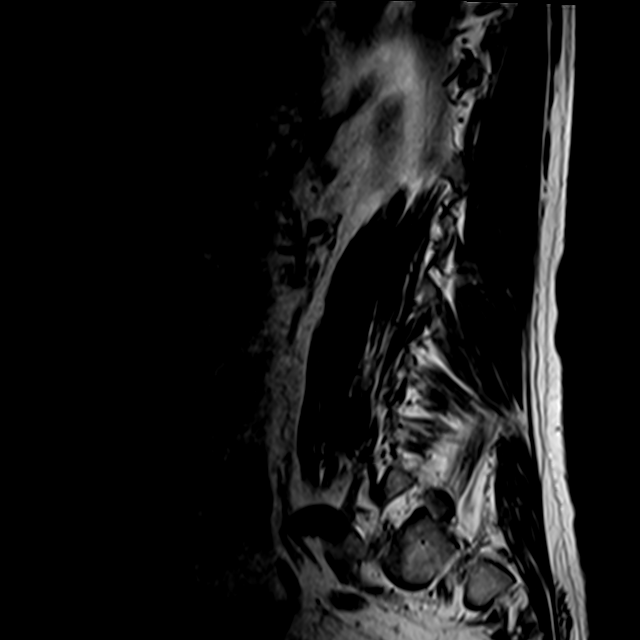

[Series 6: T2 · axial · 4.0mm · 0.28mm/px · z∈[+50,+225]mm · 3 of 50 slices shown (2 of 2)]
[im 7/50]
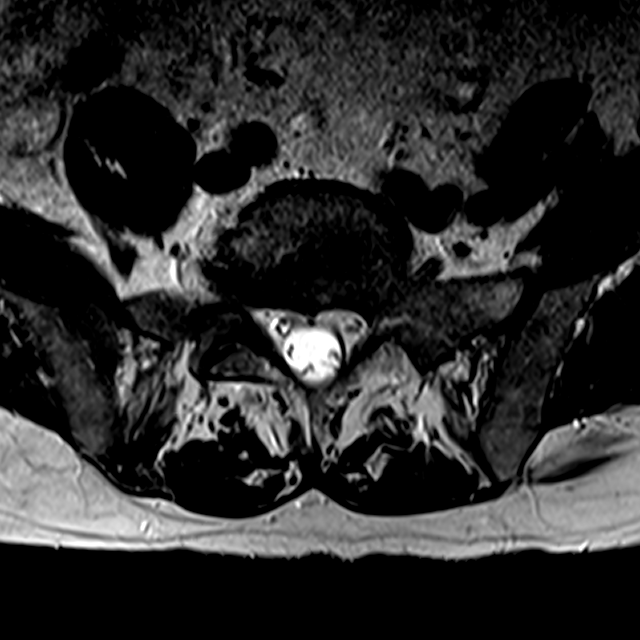
[im 27/50]
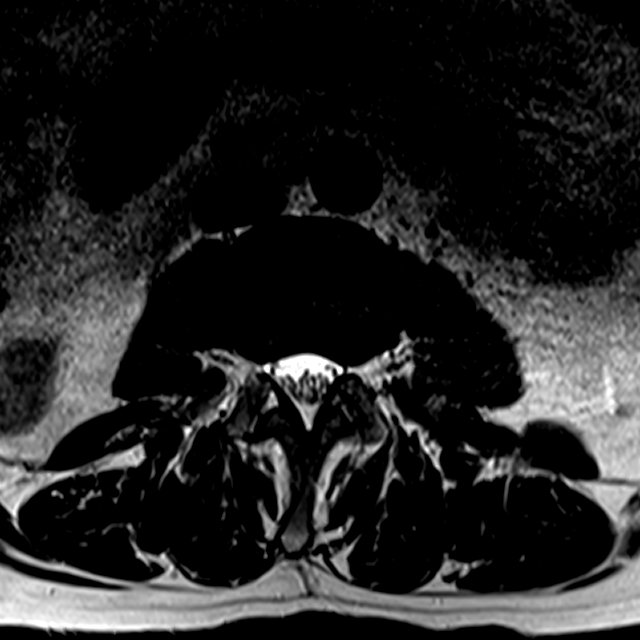
[im 43/50]
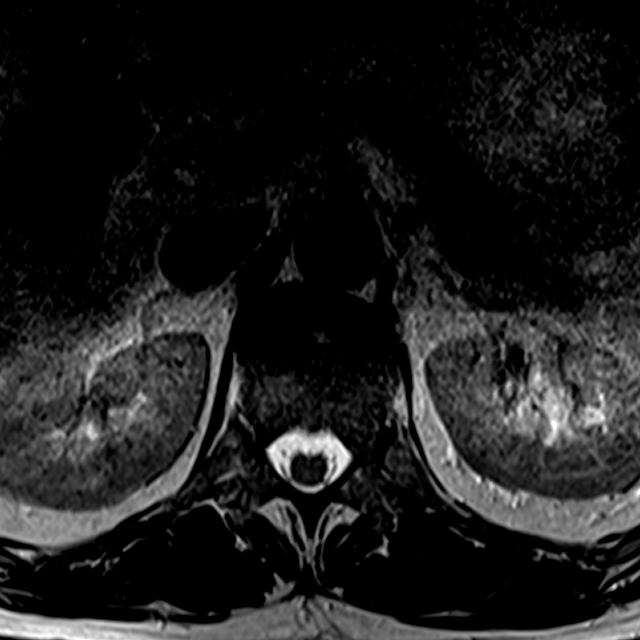

[Series 7: T1 · axial · 4.0mm · 0.28mm/px · z∈[+50,+225]mm · 3 of 50 slices shown (2 of 2)]
[im 7/50]
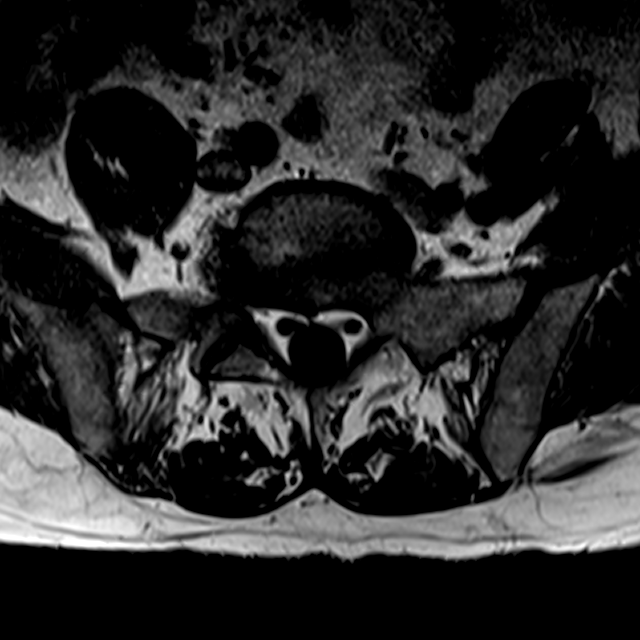
[im 27/50]
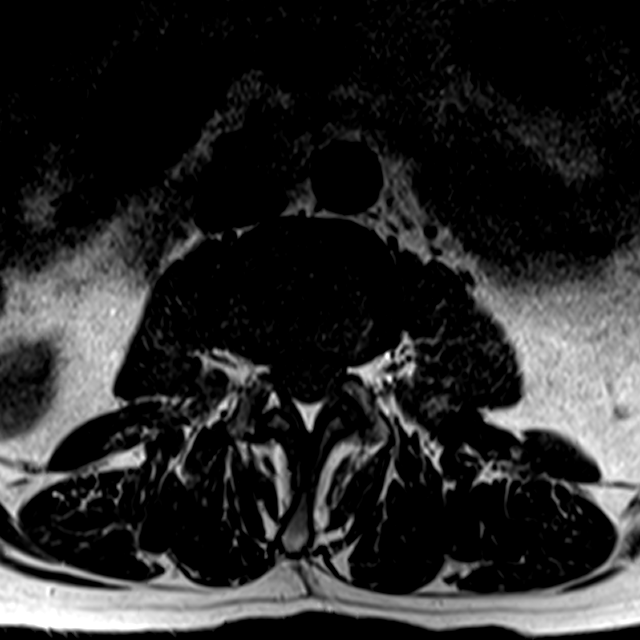
[im 43/50]
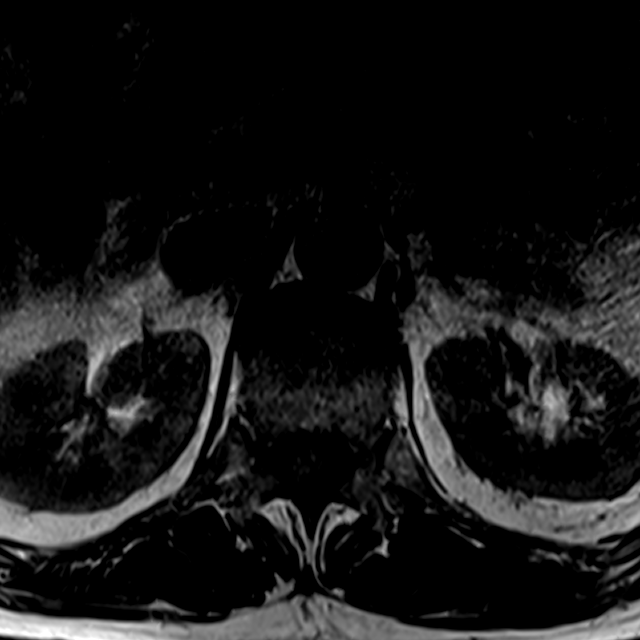

[13 of 48 positions shown; findings below may reference images not displayed]

FINDINGS: Segmentation:  Standard.

Alignment:  Physiologic.

Vertebrae:  No fracture, evidence of discitis, or bone lesion.

Conus medullaris and cauda equina: Conus extends to the L1-2 level.
Conus and cauda equina appear normal.

Paraspinal and other soft tissues: Negative.

Disc levels:

T11-12: Disc desiccation and slight disc space narrowing. No disc
bulging or protrusion. Old healed compression fracture of the
superior aspect of T12.

T12-L1: Chronic disc space narrowing. Small endplate osteophytes and
accompanying tiny disc bulge without neural impingement, unchanged.

L1-2: Negative.

L2-3: Tiny broad-based disc bulge most prominent into the left
neural foramen with no neural impingement, unchanged.

L3-4: Slight disc desiccation. Small broad-based disc bulge without
neural impingement. Old deformity of the left side of the superior
endplate of L4, unchanged. Minimal hypertrophy of the left facet
joint and ligamentum flavum.

L4-5: Slight disc desiccation. Minimal broad-based disc bulge
extending into both neural foramina without neural impingement.
Minimal hypertrophy of the ligamentum flavum and right facet joint,
stable.

L5-S1: Chronic disc space narrowing. Tiny broad-based disc bulge
with a tiny central subligamentous disc protrusion with no neural
impingement. No significant facet arthritis. No foraminal stenosis.
IMPRESSION: 1. No significant change in the appearance of the lumbar spine since
the prior study of 01/29/2013.
2. Multilevel degenerative disc disease without neural impingement.
3. Old healed compression fracture of the superior aspect of T12
with no neural impingement.

## 2019-12-16 DIAGNOSIS — M109 Gout, unspecified: Secondary | ICD-10-CM | POA: Diagnosis not present

## 2019-12-16 DIAGNOSIS — M1991 Primary osteoarthritis, unspecified site: Secondary | ICD-10-CM | POA: Diagnosis not present

## 2020-01-07 ENCOUNTER — Other Ambulatory Visit: Payer: Self-pay

## 2020-01-07 ENCOUNTER — Ambulatory Visit
Admission: EM | Admit: 2020-01-07 | Discharge: 2020-01-07 | Disposition: A | Discharge status: Home / Self Care | Payer: PPO

## 2020-01-07 NOTE — ED Triage Notes (Signed)
Pt was kicked by cow in right lower leg. Pt able to walk but concerned of blood clot, provider made aware

## 2020-01-07 NOTE — ED Notes (Signed)
Provider made aware of patient concern for blood cot. Unable to rule out in UC setting and will need to go to ED or contact PCP for ultrasound . Pt agreeable

## 2020-01-08 DIAGNOSIS — M7989 Other specified soft tissue disorders: Secondary | ICD-10-CM | POA: Diagnosis not present

## 2020-01-08 DIAGNOSIS — S8010XA Contusion of unspecified lower leg, initial encounter: Secondary | ICD-10-CM | POA: Diagnosis not present

## 2020-01-08 DIAGNOSIS — Z6826 Body mass index (BMI) 26.0-26.9, adult: Secondary | ICD-10-CM | POA: Diagnosis not present

## 2020-01-08 DIAGNOSIS — E663 Overweight: Secondary | ICD-10-CM | POA: Diagnosis not present

## 2020-09-03 DIAGNOSIS — H2513 Age-related nuclear cataract, bilateral: Secondary | ICD-10-CM | POA: Diagnosis not present

## 2020-10-27 ENCOUNTER — Other Ambulatory Visit: Payer: Self-pay

## 2020-10-27 ENCOUNTER — Other Ambulatory Visit (HOSPITAL_COMMUNITY): Payer: Self-pay | Admitting: Physician Assistant

## 2020-10-27 ENCOUNTER — Ambulatory Visit (HOSPITAL_COMMUNITY)
Admission: RE | Admit: 2020-10-27 | Discharge: 2020-10-27 | Disposition: A | Discharge status: Home / Self Care | Payer: PPO | Source: Ambulatory Visit | Attending: Physician Assistant | Admitting: Physician Assistant

## 2020-10-27 DIAGNOSIS — Z0000 Encounter for general adult medical examination without abnormal findings: Secondary | ICD-10-CM | POA: Diagnosis not present

## 2020-10-27 DIAGNOSIS — R7309 Other abnormal glucose: Secondary | ICD-10-CM | POA: Diagnosis not present

## 2020-10-27 DIAGNOSIS — Z6826 Body mass index (BMI) 26.0-26.9, adult: Secondary | ICD-10-CM | POA: Diagnosis not present

## 2020-10-27 DIAGNOSIS — M954 Acquired deformity of chest and rib: Secondary | ICD-10-CM | POA: Diagnosis not present

## 2020-10-27 DIAGNOSIS — Z0001 Encounter for general adult medical examination with abnormal findings: Secondary | ICD-10-CM | POA: Diagnosis not present

## 2020-10-27 DIAGNOSIS — Z1389 Encounter for screening for other disorder: Secondary | ICD-10-CM | POA: Diagnosis not present

## 2020-10-27 DIAGNOSIS — E663 Overweight: Secondary | ICD-10-CM | POA: Diagnosis not present

## 2020-10-27 DIAGNOSIS — R0602 Shortness of breath: Secondary | ICD-10-CM

## 2020-12-04 DIAGNOSIS — M25562 Pain in left knee: Secondary | ICD-10-CM | POA: Diagnosis not present

## 2020-12-04 DIAGNOSIS — M9905 Segmental and somatic dysfunction of pelvic region: Secondary | ICD-10-CM | POA: Diagnosis not present

## 2020-12-07 DIAGNOSIS — Z6826 Body mass index (BMI) 26.0-26.9, adult: Secondary | ICD-10-CM | POA: Diagnosis not present

## 2020-12-07 DIAGNOSIS — R0781 Pleurodynia: Secondary | ICD-10-CM | POA: Diagnosis not present

## 2020-12-07 DIAGNOSIS — E663 Overweight: Secondary | ICD-10-CM | POA: Diagnosis not present

## 2020-12-07 DIAGNOSIS — M23307 Other meniscus derangements, unspecified meniscus, left knee: Secondary | ICD-10-CM | POA: Diagnosis not present

## 2020-12-09 ENCOUNTER — Other Ambulatory Visit (HOSPITAL_COMMUNITY): Payer: Self-pay | Admitting: Internal Medicine

## 2020-12-09 DIAGNOSIS — M25562 Pain in left knee: Secondary | ICD-10-CM

## 2020-12-09 DIAGNOSIS — R079 Chest pain, unspecified: Secondary | ICD-10-CM

## 2020-12-09 DIAGNOSIS — R0600 Dyspnea, unspecified: Secondary | ICD-10-CM

## 2020-12-21 ENCOUNTER — Other Ambulatory Visit: Payer: Self-pay

## 2020-12-21 ENCOUNTER — Ambulatory Visit (HOSPITAL_COMMUNITY)
Admission: RE | Admit: 2020-12-21 | Discharge: 2020-12-21 | Disposition: A | Discharge status: Home / Self Care | Payer: PPO | Source: Ambulatory Visit | Attending: Internal Medicine | Admitting: Internal Medicine

## 2020-12-21 DIAGNOSIS — R0600 Dyspnea, unspecified: Secondary | ICD-10-CM

## 2020-12-21 DIAGNOSIS — R079 Chest pain, unspecified: Secondary | ICD-10-CM

## 2020-12-21 DIAGNOSIS — J9811 Atelectasis: Secondary | ICD-10-CM | POA: Diagnosis not present

## 2020-12-21 DIAGNOSIS — R0602 Shortness of breath: Secondary | ICD-10-CM | POA: Diagnosis not present

## 2020-12-21 DIAGNOSIS — M25562 Pain in left knee: Secondary | ICD-10-CM | POA: Diagnosis not present

## 2020-12-21 DIAGNOSIS — S83282A Other tear of lateral meniscus, current injury, left knee, initial encounter: Secondary | ICD-10-CM | POA: Diagnosis not present

## 2020-12-21 DIAGNOSIS — M7122 Synovial cyst of popliteal space [Baker], left knee: Secondary | ICD-10-CM | POA: Diagnosis not present

## 2020-12-21 LAB — POCT I-STAT CREATININE: Creatinine, Ser: 1 mg/dL (ref 0.61–1.24)

## 2020-12-21 MED ORDER — IOHEXOL 350 MG/ML SOLN
80.0000 mL | Freq: Once | INTRAVENOUS | Status: AC | PRN
  Administered 2020-12-21: 80 mL via INTRAVENOUS

## 2020-12-21 MED ORDER — SODIUM CHLORIDE (PF) 0.9 % IJ SOLN
INTRAMUSCULAR | Status: AC
  Filled 2020-12-21: qty 50

## 2021-03-31 DIAGNOSIS — M542 Cervicalgia: Secondary | ICD-10-CM | POA: Diagnosis not present

## 2021-03-31 DIAGNOSIS — M546 Pain in thoracic spine: Secondary | ICD-10-CM | POA: Diagnosis not present

## 2021-03-31 DIAGNOSIS — M9902 Segmental and somatic dysfunction of thoracic region: Secondary | ICD-10-CM | POA: Diagnosis not present

## 2021-03-31 DIAGNOSIS — M9901 Segmental and somatic dysfunction of cervical region: Secondary | ICD-10-CM | POA: Diagnosis not present

## 2021-04-07 DIAGNOSIS — M542 Cervicalgia: Secondary | ICD-10-CM | POA: Diagnosis not present

## 2021-04-07 DIAGNOSIS — M9902 Segmental and somatic dysfunction of thoracic region: Secondary | ICD-10-CM | POA: Diagnosis not present

## 2021-04-07 DIAGNOSIS — M9901 Segmental and somatic dysfunction of cervical region: Secondary | ICD-10-CM | POA: Diagnosis not present

## 2021-04-07 DIAGNOSIS — M546 Pain in thoracic spine: Secondary | ICD-10-CM | POA: Diagnosis not present

## 2021-07-05 DIAGNOSIS — K219 Gastro-esophageal reflux disease without esophagitis: Secondary | ICD-10-CM | POA: Diagnosis not present

## 2021-07-05 DIAGNOSIS — E663 Overweight: Secondary | ICD-10-CM | POA: Diagnosis not present

## 2021-07-05 DIAGNOSIS — Z6826 Body mass index (BMI) 26.0-26.9, adult: Secondary | ICD-10-CM | POA: Diagnosis not present

## 2021-07-05 DIAGNOSIS — M1991 Primary osteoarthritis, unspecified site: Secondary | ICD-10-CM | POA: Diagnosis not present

## 2021-07-05 DIAGNOSIS — M2550 Pain in unspecified joint: Secondary | ICD-10-CM | POA: Diagnosis not present

## 2021-07-05 DIAGNOSIS — L409 Psoriasis, unspecified: Secondary | ICD-10-CM | POA: Diagnosis not present

## 2021-07-05 DIAGNOSIS — M109 Gout, unspecified: Secondary | ICD-10-CM | POA: Diagnosis not present

## 2021-07-22 DIAGNOSIS — E663 Overweight: Secondary | ICD-10-CM | POA: Diagnosis not present

## 2021-07-22 DIAGNOSIS — M2550 Pain in unspecified joint: Secondary | ICD-10-CM | POA: Diagnosis not present

## 2021-07-22 DIAGNOSIS — M109 Gout, unspecified: Secondary | ICD-10-CM | POA: Diagnosis not present

## 2021-07-22 DIAGNOSIS — L4050 Arthropathic psoriasis, unspecified: Secondary | ICD-10-CM | POA: Diagnosis not present

## 2021-07-22 DIAGNOSIS — M7710 Lateral epicondylitis, unspecified elbow: Secondary | ICD-10-CM | POA: Diagnosis not present

## 2021-07-22 DIAGNOSIS — L409 Psoriasis, unspecified: Secondary | ICD-10-CM | POA: Diagnosis not present

## 2021-07-22 DIAGNOSIS — Z23 Encounter for immunization: Secondary | ICD-10-CM | POA: Diagnosis not present

## 2021-07-22 DIAGNOSIS — Z6825 Body mass index (BMI) 25.0-25.9, adult: Secondary | ICD-10-CM | POA: Diagnosis not present

## 2021-07-22 DIAGNOSIS — K219 Gastro-esophageal reflux disease without esophagitis: Secondary | ICD-10-CM | POA: Diagnosis not present

## 2021-09-24 ENCOUNTER — Other Ambulatory Visit: Payer: Self-pay

## 2021-09-24 ENCOUNTER — Encounter (HOSPITAL_COMMUNITY): Payer: Self-pay | Admitting: *Deleted

## 2021-09-24 ENCOUNTER — Emergency Department (HOSPITAL_COMMUNITY)
Admission: EM | Admit: 2021-09-24 | Discharge: 2021-09-24 | Disposition: A | Discharge status: Home / Self Care | Payer: PPO | Attending: Emergency Medicine | Admitting: Emergency Medicine

## 2021-09-24 DIAGNOSIS — Z23 Encounter for immunization: Secondary | ICD-10-CM | POA: Insufficient documentation

## 2021-09-24 DIAGNOSIS — S01312A Laceration without foreign body of left ear, initial encounter: Secondary | ICD-10-CM | POA: Insufficient documentation

## 2021-09-24 DIAGNOSIS — Y92009 Unspecified place in unspecified non-institutional (private) residence as the place of occurrence of the external cause: Secondary | ICD-10-CM | POA: Insufficient documentation

## 2021-09-24 DIAGNOSIS — W01198A Fall on same level from slipping, tripping and stumbling with subsequent striking against other object, initial encounter: Secondary | ICD-10-CM | POA: Diagnosis not present

## 2021-09-24 DIAGNOSIS — S0991XA Unspecified injury of ear, initial encounter: Secondary | ICD-10-CM | POA: Diagnosis present

## 2021-09-24 MED ORDER — LIDOCAINE HCL (PF) 1 % IJ SOLN
30.0000 mL | Freq: Once | INTRAMUSCULAR | Status: AC
Start: 2021-09-24 — End: 2021-09-24
  Administered 2021-09-24: 30 mL
  Filled 2021-09-24: qty 30

## 2021-09-24 MED ORDER — TETANUS-DIPHTH-ACELL PERTUSSIS 5-2.5-18.5 LF-MCG/0.5 IM SUSY
0.5000 mL | PREFILLED_SYRINGE | Freq: Once | INTRAMUSCULAR | Status: AC
Start: 2021-09-24 — End: 2021-09-24
  Administered 2021-09-24: 0.5 mL via INTRAMUSCULAR
  Filled 2021-09-24: qty 0.5

## 2021-09-24 NOTE — ED Triage Notes (Signed)
Laceration to left ear, fell at home ?

## 2021-09-24 NOTE — ED Provider Notes (Signed)
?[facility] ?Provider Note ? ? ?CSN: [id number] ?Arrival date & time: 09/24/21  1519 ? ?  ? ?History ? ?Chief Complaint  ?Patient presents with  ? Laceration  ? ? ?Antonio Daniel is a 71 y.o. male. ? ? ?Laceration ?Associated symptoms: no fever   ? ?  ?Patient presents to the ED for evaluation of an ear laceration.  Patient states he accidentally stepped on a pipe when he was in his shop at home.  He ended up slipping and falling and landing on his left ear against a saw.  Patient sustained a laceration to his left ear.  He denies any headache or loss of consciousness.  He is not having any neck pain.  No numbness or weakness. ?Home Medications ?Prior to Admission medications   ?Medication Sig Start Date End Date Taking? Authorizing Provider  ?Glucosamine HCl (GLUCOSAMINE PO) Take 2,000 mg by mouth at bedtime.    [provider]  ?ibuprofen (ADVIL,MOTRIN) 800 MG tablet Take 800 mg by mouth every 8 (eight) hours as needed.    [provider]  ?oxyCODONE-acetaminophen (PERCOCET) 10-325 MG tablet Take 1 tablet by mouth every 4 (four) hours as needed for pain. ?Patient not taking: Reported on 07/29/2016 02/01/16   Judeth Horn, MD  ?RABEprazole (ACIPHEX) 20 MG tablet Take 1 tablet (20 mg total) by mouth daily. 04/05/17   Annitta Needs, NP  ?   ? ?Allergies    ?No known allergies   ? ?Review of Systems   ?Review of Systems  ?Constitutional:  Negative for fever.  ? ?Physical Exam ?Updated Vital Signs ?BP 138/73   Pulse 77   Temp 98.4 ?F (36.9 ?C) (Oral)   Resp 18   Ht 1.803 m ('5\' 11"'$ )   Wt 77.1 kg   SpO2 96%   BMI 23.71 kg/m?  ?Physical Exam ?Vitals and nursing note reviewed.  ?Constitutional:   ?   General: He is not in acute distress. ?   Appearance: He is well-developed.  ?HENT:  ?   Head: Normocephalic.  ?   Comments: Laceration outer ear left external ear, helix antihelix ?   Right Ear: External ear normal.  ?   Left Ear: External ear normal.  ?Eyes:  ?   General: No  scleral icterus.    ?   Right eye: No discharge.     ?   Left eye: No discharge.  ?   Conjunctiva/sclera: Conjunctivae normal.  ?Neck:  ?   Trachea: No tracheal deviation.  ?Cardiovascular:  ?   Rate and Rhythm: Normal rate.  ?Pulmonary:  ?   Effort: Pulmonary effort is normal. No respiratory distress.  ?   Breath sounds: No stridor.  ?Abdominal:  ?   General: There is no distension.  ?Musculoskeletal:     ?   General: No swelling or deformity.  ?   Cervical back: Neck supple.  ?Skin: ?   General: Skin is warm and dry.  ?   Findings: No rash.  ?Neurological:  ?   Mental Status: He is alert.  ?   Cranial Nerves: Cranial nerve deficit: no gross deficits.  ? ? ?ED Results / Procedures / Treatments   ?Labs ?(all labs ordered are listed, but only abnormal results are displayed) ?Labs Reviewed - No data to display ? ?EKG ?None ? ?Radiology ?No results found. ? ?Procedures ?Procedures  ? ? ?Medications Ordered in ED ?Medications  ?Tdap (BOOSTRIX) injection 0.5 mL (has no administration in time range)  ?  lidocaine (PF) (XYLOCAINE) 1 % injection 30 mL (30 mLs Infiltration Given 09/24/21 1731)  ? ? ?ED Course/ Medical Decision Making/ A&P ?  ?                        ?Medical Decision Making ?Risk ?Prescription drug management. ? ? ?Laceration repair by PA Raul Del.  Pt tolerated well.  No LOC or serious head injury.  CT scanning considered but not felt to be necessary at this time. ?Evaluation and diagnostic testing in the emergency department does not suggest an emergent condition requiring admission or immediate intervention beyond what has been performed at this time.  The patient is safe for discharge and has been instructed to return immediately for worsening symptoms, change in symptoms or any other concerns. ? ? ? ? ? ? ? ?Final Clinical Impression(s) / ED Diagnoses ?Final diagnoses:  ?Laceration of left ear, initial encounter  ? ? ?Rx / DC Orders ?ED Discharge Orders   ? ? None  ? ?  ? ? ?  ?Dorie Rank, MD ?09/24/21  1737 ? ?

## 2021-09-24 NOTE — Discharge Instructions (Signed)
Follow-up to have the sutures removed in about a week.  Apply antibiotic ointment to the sutures. ?

## 2021-09-24 NOTE — ED Provider Notes (Signed)
..  Laceration Repair ? ?Date/Time: 09/24/2021 5:34 PM ?Performed by: Hendricks Limes, PA-C ?Authorized by: Hendricks Limes, PA-C  ? ?Consent:  ?  Consent obtained:  Verbal ?  Consent given by:  Patient ?  Risks, benefits, and alternatives were discussed: yes   ?  Risks discussed:  Infection and pain ?Universal protocol:  ?  Procedure explained and questions answered to patient or proxy's satisfaction: yes   ?  Relevant documents present and verified: yes   ?  Test results available: no   ?  Imaging studies available: no   ?  Patient identity confirmed:  Verbally with patient and arm band ?Anesthesia:  ?  Anesthesia method:  Local infiltration ?  Local anesthetic:  Lidocaine 1% w/o epi ?Laceration details:  ?  Location:  Ear ?  Ear location:  L ear ?  Length (cm):  10 ?  Depth (mm):  10 ?Pre-procedure details:  ?  Preparation:  Patient was prepped and draped in usual sterile fashion ?Exploration:  ?  Hemostasis achieved with:  Direct pressure ?  Imaging outcome: foreign body not noted   ?  Wound exploration: wound explored through full range of motion and entire depth of wound visualized   ?  Wound extent: areolar tissue violated   ?Treatment:  ?  Area cleansed with:  Saline ?  Amount of cleaning:  Extensive ?  Irrigation solution:  Sterile saline ?  Irrigation volume:  .25L ?  Irrigation method:  Syringe ?Skin repair:  ?  Repair method:  Sutures ?  Suture size:  6-0 ?  Suture material:  Prolene ?  Suture technique:  Simple interrupted ?  Number of sutures:  6 ?Approximation:  ?  Approximation:  Close ?Repair type:  ?  Repair type:  Complex ?Post-procedure details:  ?  Dressing:  Bulky dressing ?  Procedure completion:  Tolerated well, no immediate complications ? ?  ?Hendricks Limes, PA-C ?09/24/21 1736 ? ?  ?Dorie Rank, MD ?09/25/21 1620 ? ?

## 2021-11-01 DIAGNOSIS — M1991 Primary osteoarthritis, unspecified site: Secondary | ICD-10-CM | POA: Diagnosis not present

## 2021-11-01 DIAGNOSIS — M545 Low back pain, unspecified: Secondary | ICD-10-CM | POA: Diagnosis not present

## 2021-11-01 DIAGNOSIS — R0602 Shortness of breath: Secondary | ICD-10-CM | POA: Diagnosis not present

## 2021-11-01 DIAGNOSIS — M79641 Pain in right hand: Secondary | ICD-10-CM | POA: Diagnosis not present

## 2021-11-01 DIAGNOSIS — L409 Psoriasis, unspecified: Secondary | ICD-10-CM | POA: Diagnosis not present

## 2021-11-01 DIAGNOSIS — M542 Cervicalgia: Secondary | ICD-10-CM | POA: Diagnosis not present

## 2021-11-01 DIAGNOSIS — M79642 Pain in left hand: Secondary | ICD-10-CM | POA: Diagnosis not present

## 2021-11-01 DIAGNOSIS — Z6823 Body mass index (BMI) 23.0-23.9, adult: Secondary | ICD-10-CM | POA: Diagnosis not present

## 2021-12-16 DIAGNOSIS — J329 Chronic sinusitis, unspecified: Secondary | ICD-10-CM | POA: Diagnosis not present

## 2021-12-16 DIAGNOSIS — Z6824 Body mass index (BMI) 24.0-24.9, adult: Secondary | ICD-10-CM | POA: Diagnosis not present

## 2021-12-16 DIAGNOSIS — L409 Psoriasis, unspecified: Secondary | ICD-10-CM | POA: Diagnosis not present

## 2021-12-16 DIAGNOSIS — K219 Gastro-esophageal reflux disease without esophagitis: Secondary | ICD-10-CM | POA: Diagnosis not present

## 2021-12-23 DIAGNOSIS — M25562 Pain in left knee: Secondary | ICD-10-CM | POA: Diagnosis not present

## 2021-12-23 DIAGNOSIS — S83242A Other tear of medial meniscus, current injury, left knee, initial encounter: Secondary | ICD-10-CM | POA: Diagnosis not present

## 2021-12-24 ENCOUNTER — Other Ambulatory Visit (HOSPITAL_COMMUNITY): Payer: Self-pay | Admitting: Internal Medicine

## 2021-12-24 ENCOUNTER — Ambulatory Visit (HOSPITAL_COMMUNITY)
Admission: RE | Admit: 2021-12-24 | Discharge: 2021-12-24 | Disposition: A | Discharge status: Home / Self Care | Payer: PPO | Source: Ambulatory Visit | Attending: Internal Medicine | Admitting: Internal Medicine

## 2021-12-24 DIAGNOSIS — R059 Cough, unspecified: Secondary | ICD-10-CM | POA: Insufficient documentation

## 2022-01-13 DIAGNOSIS — Z125 Encounter for screening for malignant neoplasm of prostate: Secondary | ICD-10-CM | POA: Diagnosis not present

## 2022-01-13 DIAGNOSIS — Z0001 Encounter for general adult medical examination with abnormal findings: Secondary | ICD-10-CM | POA: Diagnosis not present

## 2022-01-13 DIAGNOSIS — L4050 Arthropathic psoriasis, unspecified: Secondary | ICD-10-CM | POA: Diagnosis not present

## 2022-01-13 DIAGNOSIS — K222 Esophageal obstruction: Secondary | ICD-10-CM | POA: Diagnosis not present

## 2022-01-13 DIAGNOSIS — Z1331 Encounter for screening for depression: Secondary | ICD-10-CM | POA: Diagnosis not present

## 2022-01-13 DIAGNOSIS — D518 Other vitamin B12 deficiency anemias: Secondary | ICD-10-CM | POA: Diagnosis not present

## 2022-01-13 DIAGNOSIS — E063 Autoimmune thyroiditis: Secondary | ICD-10-CM | POA: Diagnosis not present

## 2022-01-13 DIAGNOSIS — R634 Abnormal weight loss: Secondary | ICD-10-CM | POA: Diagnosis not present

## 2022-01-13 DIAGNOSIS — R5383 Other fatigue: Secondary | ICD-10-CM | POA: Diagnosis not present

## 2022-01-13 DIAGNOSIS — M2550 Pain in unspecified joint: Secondary | ICD-10-CM | POA: Diagnosis not present

## 2022-01-13 DIAGNOSIS — E559 Vitamin D deficiency, unspecified: Secondary | ICD-10-CM | POA: Diagnosis not present

## 2022-01-13 DIAGNOSIS — M1991 Primary osteoarthritis, unspecified site: Secondary | ICD-10-CM | POA: Diagnosis not present

## 2022-01-13 DIAGNOSIS — Z6824 Body mass index (BMI) 24.0-24.9, adult: Secondary | ICD-10-CM | POA: Diagnosis not present

## 2022-01-13 DIAGNOSIS — K219 Gastro-esophageal reflux disease without esophagitis: Secondary | ICD-10-CM | POA: Diagnosis not present

## 2022-01-17 DIAGNOSIS — M5442 Lumbago with sciatica, left side: Secondary | ICD-10-CM | POA: Diagnosis not present

## 2022-01-17 DIAGNOSIS — M9903 Segmental and somatic dysfunction of lumbar region: Secondary | ICD-10-CM | POA: Diagnosis not present

## 2022-01-17 DIAGNOSIS — M9902 Segmental and somatic dysfunction of thoracic region: Secondary | ICD-10-CM | POA: Diagnosis not present

## 2022-01-17 DIAGNOSIS — M9905 Segmental and somatic dysfunction of pelvic region: Secondary | ICD-10-CM | POA: Diagnosis not present

## 2022-01-19 DIAGNOSIS — M9902 Segmental and somatic dysfunction of thoracic region: Secondary | ICD-10-CM | POA: Diagnosis not present

## 2022-01-19 DIAGNOSIS — M5442 Lumbago with sciatica, left side: Secondary | ICD-10-CM | POA: Diagnosis not present

## 2022-01-19 DIAGNOSIS — M9903 Segmental and somatic dysfunction of lumbar region: Secondary | ICD-10-CM | POA: Diagnosis not present

## 2022-01-19 DIAGNOSIS — M9905 Segmental and somatic dysfunction of pelvic region: Secondary | ICD-10-CM | POA: Diagnosis not present

## 2022-01-24 ENCOUNTER — Telehealth: Payer: Self-pay | Admitting: Gastroenterology

## 2022-01-24 NOTE — Telephone Encounter (Signed)
Hi Dr. Havery Moros,  This patient is requesting a transfer of care to you.  He was previously seen at [facility] by Dr. Gala Romney and has had upper endoscopies with him.  (Reports are in Epic).  He thinks he is due for colonoscopy; however, the colonoscopy report was not in Epic.  He was instructed to get that procedure and path report for you to also look at.  The reason he is requesting the transfer is because his wife is a patient of yours and highly recommended you to him.  They want to be seen by the same doctor for their care.  He also wants to talk to you about doing a colonoscopy and EGD at the same time.  Please let me know if you approve the transfer.  Thank you.

## 2022-01-24 NOTE — Telephone Encounter (Signed)
Addendum:  A urgent referral was also sent to you from Dr. Gerarda Fraction due to patient having an esophageal stricture.

## 2022-01-25 ENCOUNTER — Encounter: Payer: Self-pay | Admitting: Gastroenterology

## 2022-02-11 NOTE — Patient Instructions (Addendum)
SURGICAL WAITING ROOM VISITATION Patients having surgery or a procedure may have no more than 2 support people in the waiting area - these visitors may rotate.   Children under the age of 61 must have an adult with them who is not the patient. If the patient needs to stay at the hospital during part of their recovery, the visitor guidelines for inpatient rooms apply. Pre-op nurse will coordinate an appropriate time for 1 support person to accompany patient in pre-op.  This support person may not rotate.    Please refer to the [facility] website for the visitor guidelines for Inpatients (after your surgery is over and you are in a regular room).      Your procedure is scheduled on: 02-28-22   Report to [facility] Main Entrance    Report to admitting at 1:00 PM   Call this number if you have problems the morning of surgery [phone]   Do not eat food :After Midnight.   After Midnight you may have the following liquids until 12:15 PM DAY OF SURGERY  Water Non-Citrus Juices (without pulp, NO RED) Carbonated Beverages Black Coffee (NO MILK/CREAM OR CREAMERS, sugar ok)  Clear Tea (NO MILK/CREAM OR CREAMERS, sugar ok) regular and decaf                             Plain Jell-O (NO RED)                                           Fruit ices (not with fruit pulp, NO RED)                                     Popsicles (NO RED)                                                               Sports drinks like Gatorade (NO RED)                   The day of surgery:  Drink ONE (1) Pre-Surgery Clear Ensure at 12:15 PM the morning of surgery. Drink in one sitting. Do not sip.  This drink was given to you during your hospital  pre-op appointment visit. Nothing else to drink after completing the Pre-Surgery Clear Ensure          If you have questions, please contact your surgeon's office.   FOLLOW ANY ADDITIONAL PRE OP INSTRUCTIONS YOU RECEIVED FROM YOUR SURGEON'S OFFICE!!!      Oral Hygiene is also important to reduce your risk of infection.                                    Remember - BRUSH YOUR TEETH THE MORNING OF SURGERY WITH YOUR REGULAR TOOTHPASTE   Do NOT smoke after Midnight   Take these medicines the morning of surgery with A SIP OF WATER: None  You may not have any metal on your body including  jewelry, and body piercing             Do not wear lotions, powders, cologne, or deodorant              Men may shave face and neck.   Do not bring valuables to the hospital. [place].   Contacts, dentures or bridgework may not be worn into surgery.  DO NOT [place]. PHARMACY WILL DISPENSE MEDICATIONS LISTED ON YOUR MEDICATION LIST TO YOU DURING YOUR ADMISSION [place]!   Patients discharged on the day of surgery will not be allowed to drive home.  Someone NEEDS to stay with you for the first 24 hours after anesthesia.  Please read over the following fact sheets you were given: IF YOU HAVE QUESTIONS ABOUT YOUR PRE-OP INSTRUCTIONS PLEASE CALL [facility] - Preparing for Surgery Before surgery, you can play an important role.  Because skin is not sterile, your skin needs to be as free of germs as possible.  You can reduce the number of germs on your skin by washing with CHG (chlorahexidine gluconate) soap before surgery.  CHG is an antiseptic cleaner which kills germs and bonds with the skin to continue killing germs even after washing. Please DO NOT use if you have an allergy to CHG or antibacterial soaps.  If your skin becomes reddened/irritated stop using the CHG and inform your nurse when you arrive at Short Stay. Do not shave (including legs and underarms) for at least 48 hours prior to the first CHG shower.  You may shave your face/neck.  Please follow these instructions carefully:  1.  Shower with CHG Soap the night before  surgery and the  morning of surgery.  2.  If you choose to wash your hair, wash your hair first as usual with your normal  shampoo.  3.  After you shampoo, rinse your hair and body thoroughly to remove the shampoo.                             4.  Use CHG as you would any other liquid soap.  You can apply chg directly to the skin and wash.  Gently with a scrungie or clean washcloth.  5.  Apply the CHG Soap to your body ONLY FROM THE NECK DOWN.   Do   not use on face/ open                           Wound or open sores. Avoid contact with eyes, ears mouth and   genitals (private parts).                       Wash face,  Genitals (private parts) with your normal soap.             6.  Wash thoroughly, paying special attention to the area where your    surgery  will be performed.  7.  Thoroughly rinse your body with warm water from the neck down.  8.  DO NOT shower/wash with your normal soap after using and rinsing off the CHG Soap.                9.  Pat yourself dry with a clean  towel.            10.  Wear clean pajamas.            11.  Place clean sheets on your bed the night of your first shower and do not  sleep with pets. Day of Surgery : Do not apply any lotions/deodorants the morning of surgery.  Please wear clean clothes to the hospital/surgery center.  FAILURE TO FOLLOW THESE INSTRUCTIONS MAY RESULT IN THE CANCELLATION OF YOUR SURGERY  PATIENT SIGNATURE_________________________________  NURSE SIGNATURE__________________________________  ________________________________________________________________________     Antonio Daniel  An incentive spirometer is a tool that can help keep your lungs clear and active. This tool measures how well you are filling your lungs with each breath. Taking long deep breaths may help reverse or decrease the chance of developing breathing (pulmonary) problems (especially infection) following: A long period of time when you are unable to move or be  active. BEFORE THE PROCEDURE  If the spirometer includes an indicator to show your best effort, your nurse or respiratory therapist will set it to a desired goal. If possible, sit up straight or lean slightly forward. Try not to slouch. Hold the incentive spirometer in an upright position. INSTRUCTIONS FOR USE  Sit on the edge of your bed if possible, or sit up as far as you can in bed or on a chair. Hold the incentive spirometer in an upright position. Breathe out normally. Place the mouthpiece in your mouth and seal your lips tightly around it. Breathe in slowly and as deeply as possible, raising the piston or the ball toward the top of the column. Hold your breath for 3-5 seconds or for as long as possible. Allow the piston or ball to fall to the bottom of the column. Remove the mouthpiece from your mouth and breathe out normally. Rest for a few seconds and repeat Steps 1 through 7 at least 10 times every 1-2 hours when you are awake. Take your time and take a few normal breaths between deep breaths. The spirometer may include an indicator to show your best effort. Use the indicator as a goal to work toward during each repetition. After each set of 10 deep breaths, practice coughing to be sure your lungs are clear. If you have an incision (the cut made at the time of surgery), support your incision when coughing by placing a pillow or rolled up towels firmly against it. Once you are able to get out of bed, walk around indoors and cough well. You may stop using the incentive spirometer when instructed by your caregiver.  RISKS AND COMPLICATIONS Take your time so you do not get dizzy or light-headed. If you are in pain, you may need to take or ask for pain medication before doing incentive spirometry. It is harder to take a deep breath if you are having pain. AFTER USE Rest and breathe slowly and easily. It can be helpful to keep track of a log of your progress. Your caregiver can provide you  with a simple table to help with this. If you are using the spirometer at home, follow these instructions: Walker IF:  You are having difficultly using the spirometer. You have trouble using the spirometer as often as instructed. Your pain medication is not giving enough relief while using the spirometer. You develop fever of 100.5 F (38.1 C) or higher. SEEK IMMEDIATE MEDICAL CARE IF:  You cough up bloody sputum that had not been present before. You  develop fever of 102 F (38.9 C) or greater. You develop worsening pain at or near the incision site. MAKE SURE YOU:  Understand these instructions. Will watch your condition. Will get help right away if you are not doing well or get worse. Document Released: 11/14/2006 Document Revised: 09/26/2011 Document Reviewed: 01/15/2007 [facility] Patient Information 2014 [place], [place].   ________________________________________________________________________

## 2022-02-16 DIAGNOSIS — M25552 Pain in left hip: Secondary | ICD-10-CM | POA: Diagnosis not present

## 2022-02-22 ENCOUNTER — Other Ambulatory Visit: Payer: Self-pay

## 2022-02-22 ENCOUNTER — Encounter (HOSPITAL_COMMUNITY): Payer: Self-pay

## 2022-02-22 ENCOUNTER — Encounter (HOSPITAL_COMMUNITY)
Admission: RE | Admit: 2022-02-22 | Discharge: 2022-02-22 | Disposition: A | Discharge status: Home / Self Care | Payer: PPO | Source: Ambulatory Visit | Attending: Orthopedic Surgery | Admitting: Orthopedic Surgery

## 2022-02-22 VITALS — BP 136/88 | HR 65 | Temp 98.6°F | Resp 16 | Ht 70.0 in | Wt 158.8 lb

## 2022-02-22 DIAGNOSIS — Z01818 Encounter for other preprocedural examination: Secondary | ICD-10-CM | POA: Diagnosis not present

## 2022-02-22 DIAGNOSIS — D649 Anemia, unspecified: Secondary | ICD-10-CM

## 2022-02-22 HISTORY — DX: Anemia, unspecified: D64.9

## 2022-02-22 LAB — CBC
HCT: 49.1 % (ref 39.0–52.0)
Hemoglobin: 16.2 g/dL (ref 13.0–17.0)
MCH: 29.3 pg (ref 26.0–34.0)
MCHC: 33 g/dL (ref 30.0–36.0)
MCV: 88.8 fL (ref 80.0–100.0)
Platelets: 260 10*3/uL (ref 150–400)
RBC: 5.53 MIL/uL (ref 4.22–5.81)
RDW: 14.6 % (ref 11.5–15.5)
WBC: 12 10*3/uL — ABNORMAL HIGH (ref 4.0–10.5)
nRBC: 0 % (ref 0.0–0.2)

## 2022-02-22 NOTE — Progress Notes (Signed)
COVID Vaccine Completed:  Yes  Date of COVID positive in last 90 days:  No  PCP - [employer] [employer], MD Cardiologist - N/A  Chest x-ray - 12-24-21 Epic EKG - at [facility] -  N/A Cardiac Cath -  N/A Pacemaker/ICD device last checked: Spinal Cord Stimulator: N/A  Bowel Prep -  N/A  Sleep Study -  N/A CPAP -   Fasting Blood Sugar -  N/A Checks Blood Sugar _____ times a day  Blood Thinner Instructions: N/A Aspirin Instructions: Last Dose:  Activity level:   Can go up a flight of stairs and perform activities of daily living without stopping and without symptoms of chest pain or shortness of breath.  Anesthesia review:  N/A  Patient denies shortness of breath, fever, cough and chest pain at PAT appointment  Patient verbalized understanding of instructions that were given to them at the PAT appointment. Patient was also instructed that they will need to review over the PAT instructions again at home before surgery.

## 2022-02-28 ENCOUNTER — Ambulatory Visit (HOSPITAL_COMMUNITY)
Admission: RE | Admit: 2022-02-28 | Discharge: 2022-02-28 | Disposition: A | Discharge status: Home / Self Care | Payer: PPO | Attending: Orthopedic Surgery | Admitting: Orthopedic Surgery

## 2022-02-28 ENCOUNTER — Ambulatory Visit (HOSPITAL_BASED_OUTPATIENT_CLINIC_OR_DEPARTMENT_OTHER): Payer: PPO | Admitting: Certified Registered"

## 2022-02-28 ENCOUNTER — Encounter (HOSPITAL_COMMUNITY)
Admission: RE | Disposition: A | Discharge status: Home / Self Care | Payer: Self-pay | Source: Home / Self Care | Attending: Orthopedic Surgery

## 2022-02-28 ENCOUNTER — Encounter (HOSPITAL_COMMUNITY): Payer: Self-pay | Admitting: Orthopedic Surgery

## 2022-02-28 ENCOUNTER — Other Ambulatory Visit: Payer: Self-pay

## 2022-02-28 ENCOUNTER — Ambulatory Visit (HOSPITAL_COMMUNITY): Payer: PPO | Admitting: Certified Registered"

## 2022-02-28 DIAGNOSIS — S83242A Other tear of medial meniscus, current injury, left knee, initial encounter: Secondary | ICD-10-CM

## 2022-02-28 DIAGNOSIS — M94262 Chondromalacia, left knee: Secondary | ICD-10-CM | POA: Diagnosis not present

## 2022-02-28 DIAGNOSIS — Z8719 Personal history of other diseases of the digestive system: Secondary | ICD-10-CM | POA: Diagnosis not present

## 2022-02-28 DIAGNOSIS — M2242 Chondromalacia patellae, left knee: Secondary | ICD-10-CM | POA: Insufficient documentation

## 2022-02-28 DIAGNOSIS — K219 Gastro-esophageal reflux disease without esophagitis: Secondary | ICD-10-CM | POA: Insufficient documentation

## 2022-02-28 DIAGNOSIS — S83249A Other tear of medial meniscus, current injury, unspecified knee, initial encounter: Secondary | ICD-10-CM | POA: Diagnosis present

## 2022-02-28 DIAGNOSIS — X58XXXA Exposure to other specified factors, initial encounter: Secondary | ICD-10-CM | POA: Insufficient documentation

## 2022-02-28 HISTORY — PX: KNEE ARTHROSCOPY: SHX127

## 2022-02-28 SURGERY — ARTHROSCOPY, KNEE
Anesthesia: Choice | Site: Knee | Laterality: Left

## 2022-02-28 MED ORDER — DEXAMETHASONE SODIUM PHOSPHATE 10 MG/ML IJ SOLN
INTRAMUSCULAR | Status: AC
  Filled 2022-02-28: qty 1

## 2022-02-28 MED ORDER — PROPOFOL 10 MG/ML IV BOLUS
INTRAVENOUS | Status: AC
Start: 2022-02-28 — End: ?
  Filled 2022-02-28: qty 20

## 2022-02-28 MED ORDER — FENTANYL CITRATE PF 50 MCG/ML IJ SOSY: 25.0000 ug | PREFILLED_SYRINGE | INTRAMUSCULAR | Status: DC | PRN

## 2022-02-28 MED ORDER — CHLORHEXIDINE GLUCONATE 4 % EX LIQD: 60.0000 mL | Freq: Once | CUTANEOUS | Status: DC

## 2022-02-28 MED ORDER — SODIUM CHLORIDE 0.9 % IR SOLN
Status: DC | PRN
  Administered 2022-02-28: 6000 mL

## 2022-02-28 MED ORDER — PROPOFOL 500 MG/50ML IV EMUL
INTRAVENOUS | Status: DC | PRN
  Administered 2022-02-28: 175 ug/kg/min via INTRAVENOUS

## 2022-02-28 MED ORDER — LIDOCAINE 2% (20 MG/ML) 5 ML SYRINGE
INTRAMUSCULAR | Status: DC | PRN
  Administered 2022-02-28: 80 mg via INTRAVENOUS

## 2022-02-28 MED ORDER — MIDAZOLAM HCL 5 MG/5ML IJ SOLN
INTRAMUSCULAR | Status: DC | PRN
  Administered 2022-02-28: 2 mg via INTRAVENOUS

## 2022-02-28 MED ORDER — LACTATED RINGERS IV SOLN: INTRAVENOUS | Status: DC

## 2022-02-28 MED ORDER — OXYCODONE HCL 5 MG/5ML PO SOLN: 5.0000 mg | Freq: Once | ORAL | Status: DC | PRN

## 2022-02-28 MED ORDER — ASPIRIN 81 MG PO TBEC: 81.0000 mg | DELAYED_RELEASE_TABLET | Freq: Every day | ORAL | 0 refills | Status: AC

## 2022-02-28 MED ORDER — ACETAMINOPHEN 10 MG/ML IV SOLN
1000.0000 mg | Freq: Four times a day (QID) | INTRAVENOUS | Status: DC
  Administered 2022-02-28: 1000 mg via INTRAVENOUS
  Filled 2022-02-28: qty 100

## 2022-02-28 MED ORDER — ORAL CARE MOUTH RINSE: 15.0000 mL | Freq: Once | OROMUCOSAL | Status: AC

## 2022-02-28 MED ORDER — ONDANSETRON HCL 4 MG/2ML IJ SOLN
INTRAMUSCULAR | Status: AC
  Filled 2022-02-28: qty 2

## 2022-02-28 MED ORDER — PROPOFOL 10 MG/ML IV BOLUS
INTRAVENOUS | Status: DC | PRN
  Administered 2022-02-28: 160 mg via INTRAVENOUS

## 2022-02-28 MED ORDER — ACETAMINOPHEN 160 MG/5ML PO SOLN: 1000.0000 mg | Freq: Once | ORAL | Status: DC | PRN

## 2022-02-28 MED ORDER — ACETAMINOPHEN 10 MG/ML IV SOLN: 1000.0000 mg | Freq: Once | INTRAVENOUS | Status: DC | PRN

## 2022-02-28 MED ORDER — ACETAMINOPHEN 500 MG PO TABS: 1000.0000 mg | ORAL_TABLET | Freq: Once | ORAL | Status: DC | PRN

## 2022-02-28 MED ORDER — BUPIVACAINE-EPINEPHRINE 0.5% -1:200000 IJ SOLN
INTRAMUSCULAR | Status: AC
  Filled 2022-02-28: qty 1

## 2022-02-28 MED ORDER — WATER FOR IRRIGATION, STERILE IR SOLN: Status: DC | PRN

## 2022-02-28 MED ORDER — CHLORHEXIDINE GLUCONATE 0.12 % MT SOLN
15.0000 mL | Freq: Once | OROMUCOSAL | Status: AC
  Administered 2022-02-28: 15 mL via OROMUCOSAL

## 2022-02-28 MED ORDER — POVIDONE-IODINE 10 % EX SWAB
2.0000 | Freq: Once | CUTANEOUS | Status: AC
  Administered 2022-02-28: 2 via TOPICAL

## 2022-02-28 MED ORDER — PHENYLEPHRINE 80 MCG/ML (10ML) SYRINGE FOR IV PUSH (FOR BLOOD PRESSURE SUPPORT)
PREFILLED_SYRINGE | INTRAVENOUS | Status: AC
  Filled 2022-02-28: qty 10

## 2022-02-28 MED ORDER — ONDANSETRON HCL 4 MG/2ML IJ SOLN
INTRAMUSCULAR | Status: DC | PRN
  Administered 2022-02-28: 4 mg via INTRAVENOUS

## 2022-02-28 MED ORDER — FENTANYL CITRATE (PF) 100 MCG/2ML IJ SOLN
INTRAMUSCULAR | Status: DC | PRN
Start: 2022-02-28 — End: 2022-02-28
  Administered 2022-02-28: 25 ug via INTRAVENOUS
  Administered 2022-02-28: 50 ug via INTRAVENOUS
  Administered 2022-02-28: 25 ug via INTRAVENOUS

## 2022-02-28 MED ORDER — HYDROCODONE-ACETAMINOPHEN 5-325 MG PO TABS: 1.0000 | ORAL_TABLET | Freq: Four times a day (QID) | ORAL | 0 refills | Status: DC | PRN

## 2022-02-28 MED ORDER — BUPIVACAINE-EPINEPHRINE (PF) 0.5% -1:200000 IJ SOLN
INTRAMUSCULAR | Status: DC | PRN
  Administered 2022-02-28: 20 mL via PERINEURAL

## 2022-02-28 MED ORDER — PROPOFOL 500 MG/50ML IV EMUL
INTRAVENOUS | Status: AC
  Filled 2022-02-28: qty 50

## 2022-02-28 MED ORDER — OXYCODONE HCL 5 MG PO TABS: 5.0000 mg | ORAL_TABLET | Freq: Once | ORAL | Status: DC | PRN

## 2022-02-28 MED ORDER — MIDAZOLAM HCL 2 MG/2ML IJ SOLN
INTRAMUSCULAR | Status: AC
  Filled 2022-02-28: qty 2

## 2022-02-28 MED ORDER — DEXAMETHASONE SODIUM PHOSPHATE 10 MG/ML IJ SOLN
8.0000 mg | Freq: Once | INTRAMUSCULAR | Status: AC
  Administered 2022-02-28: 10 mg via INTRAVENOUS

## 2022-02-28 MED ORDER — METHOCARBAMOL 500 MG PO TABS: 500.0000 mg | ORAL_TABLET | Freq: Four times a day (QID) | ORAL | 0 refills | Status: DC | PRN

## 2022-02-28 MED ORDER — CEFAZOLIN SODIUM-DEXTROSE 2-4 GM/100ML-% IV SOLN
2.0000 g | INTRAVENOUS | Status: AC
  Administered 2022-02-28: 2 g via INTRAVENOUS
  Filled 2022-02-28: qty 100

## 2022-02-28 MED ORDER — FENTANYL CITRATE (PF) 100 MCG/2ML IJ SOLN
INTRAMUSCULAR | Status: AC
  Filled 2022-02-28: qty 2

## 2022-02-28 MED ORDER — PHENYLEPHRINE 80 MCG/ML (10ML) SYRINGE FOR IV PUSH (FOR BLOOD PRESSURE SUPPORT)
PREFILLED_SYRINGE | INTRAVENOUS | Status: DC | PRN
  Administered 2022-02-28: 80 ug via INTRAVENOUS

## 2022-02-28 SURGICAL SUPPLY — 32 items
BAG COUNTER SPONGE SURGICOUNT (BAG) IMPLANT
BAG SPNG CNTER NS LX DISP (BAG)
BNDG ELASTIC 6X5.8 VLCR STR LF (GAUZE/BANDAGES/DRESSINGS) ×2 IMPLANT
COVER SURGICAL LIGHT HANDLE (MISCELLANEOUS) ×2 IMPLANT
DISSECTOR 4.0MM X 13CM (MISCELLANEOUS) ×2 IMPLANT
DRAPE ARTHROSCOPY W/POUCH 114 (DRAPES) ×2 IMPLANT
DRAPE U-SHAPE 47X51 STRL (DRAPES) ×2 IMPLANT
DRSG EMULSION OIL 3X3 NADH (GAUZE/BANDAGES/DRESSINGS) ×2 IMPLANT
DRSG PAD ABDOMINAL 8X10 ST (GAUZE/BANDAGES/DRESSINGS) ×2 IMPLANT
DURAPREP 26ML APPLICATOR (WOUND CARE) ×2 IMPLANT
GAUZE 4X4 16PLY ~~LOC~~+RFID DBL (SPONGE) ×2 IMPLANT
GAUZE SPONGE 4X4 12PLY STRL (GAUZE/BANDAGES/DRESSINGS) ×2 IMPLANT
GLOVE BIO SURGEON STRL SZ 6.5 (GLOVE) ×4 IMPLANT
GLOVE BIO SURGEON STRL SZ8 (GLOVE) ×4 IMPLANT
GLOVE BIOGEL PI IND STRL 7.0 (GLOVE) ×2 IMPLANT
GLOVE BIOGEL PI IND STRL 8 (GLOVE) ×1 IMPLANT
GLOVE BIOGEL PI INDICATOR 7.0 (GLOVE) ×2
GLOVE BIOGEL PI INDICATOR 8 (GLOVE) ×1
GLOVE SURG SS PI 7.0 STRL IVOR (GLOVE) ×2 IMPLANT
GOWN STRL REUS W/ TWL LRG LVL3 (GOWN DISPOSABLE) ×1 IMPLANT
GOWN STRL REUS W/TWL LRG LVL3 (GOWN DISPOSABLE) ×2
KIT BASIN OR (CUSTOM PROCEDURE TRAY) ×2 IMPLANT
MANIFOLD NEPTUNE II (INSTRUMENTS) ×2 IMPLANT
PACK ARTHROSCOPY WL (CUSTOM PROCEDURE TRAY) ×2 IMPLANT
PADDING CAST COTTON 6X4 STRL (CAST SUPPLIES) ×2 IMPLANT
PORT APPOLLO RF 90DEGREE MULTI (SURGICAL WAND) ×2 IMPLANT
PROTECTOR NERVE ULNAR (MISCELLANEOUS) ×2 IMPLANT
SUT ETHILON 4 0 PS 2 18 (SUTURE) ×2 IMPLANT
TOWEL OR 17X26 10 PK STRL BLUE (TOWEL DISPOSABLE) ×2 IMPLANT
TUBING ARTHROSCOPY IRRIG 16FT (MISCELLANEOUS) ×2 IMPLANT
TUBING CONNECTING 10 (TUBING) ×2 IMPLANT
WRAP KNEE MAXI GEL POST OP (GAUZE/BANDAGES/DRESSINGS) ×2 IMPLANT

## 2022-02-28 NOTE — Op Note (Signed)
Operative Report- KNEE ARTHROSCOPY  Preoperative diagnosis-  Left knee medial meniscal tear  Postoperative diagnosis Left- knee medial meniscal tear   Procedure- Left knee arthroscopy with medial  meniscal debridement    Surgeon- Dione Plover. Mussa Groesbeck, MD  Anesthesia-General  EBL-  Minimal  Complications- None  Condition- PACU - hemodynamically stable.  Brief clinical note- -Antonio Daniel is a 71 y.o.  male with a several month history of Left knee pain and mechanical symptoms. Exam and history suggested medial  meniscal tear confirmed by MRI. The patient presents now for arthroscopy and debridement   Procedure in detail -       After successful administration of General anesthetic, a tourmiquet is placed high on the Left  thigh and the Left lower extremity is prepped and draped in the usual sterile fashion. Time out is performed by the surgical team. Standard superomedial and inferolateral portal sites are marked and incisions made with an 11 blade. The inflow cannula is passed through the superomedial portal and camera through the inferolateral portal and inflow is initiated. Arthroscopic visualization proceeds.      The undersurface of the patella and trochlea are visualized and there is mild chondromalacia. The medial and lateral gutters are visualized and there are no loose bodies. Flexion and valgus force is applied to the knee and the medial compartment is entered. A spinal needle is passed into the joint through the site marked for the inferomedial portal. A small incision is made and the dilator passed into the joint. The findings for the medial compartment are unstable tear of body and posterior horn medial meniscus with Grade II chondromalacia medial femoral condyle . The tear is debrided to a stable base with baskets and a shaver and sealed off with the Arthrocare. It is probed and found to be stable.    The intercondylar notch is visualized and the ACL appears normal . The  lateral compartment is entered and the findings are normal .      The joint is again inspected and there are no other tears, defects or loose bodies identified. The arthroscopic equipment is then removed from the inferior portals which are closed with interrupted 4-0 nylon. 20 ml of .25% Marcaine with epinephrine are injected through the inflow cannula and the cannula is then removed and the portal closed with nylon. The incisions are cleaned and dried and a bulky sterile dressing is applied. The patient is then awakened and transported to recovery in stable condition.   02/28/2022, 4:18 PM

## 2022-02-28 NOTE — Transfer of Care (Signed)
Immediate Anesthesia Transfer of Care Note  Patient: Antonio Daniel  Procedure(s) Performed: Left knee arthroscopy; meniscal debridement (Left: Knee)  Patient Location: PACU  Anesthesia Type:General  Level of Consciousness: awake, alert  and oriented  Airway & Oxygen Therapy: Patient Spontanous Breathing and Patient connected to face mask oxygen  Post-op Assessment: Report given to RN and Post -op Vital signs reviewed and stable  Post vital signs: Reviewed and stable  Last Vitals:  Vitals Value Taken Time  BP 115/74 02/28/22 1624  Temp    Pulse 66 02/28/22 1626  Resp 12 02/28/22 1626  SpO2 100 % 02/28/22 1626  Vitals shown include unvalidated device data.  Last Pain:  Vitals:   02/28/22 1331  TempSrc: Oral  PainSc: 8          Complications: No notable events documented.

## 2022-02-28 NOTE — Anesthesia Procedure Notes (Signed)
Procedure Name: LMA Insertion Date/Time: 02/28/2022 3:38 PM  Performed by: Capucine Tryon D, CRNAPre-anesthesia Checklist: Patient identified, Emergency Drugs available, Suction available and Patient being monitored Patient Re-evaluated:Patient Re-evaluated prior to induction Oxygen Delivery Method: Circle system utilized Preoxygenation: Pre-oxygenation with 100% oxygen Induction Type: IV induction Ventilation: Mask ventilation without difficulty LMA: LMA inserted LMA Size: 4.0 Tube type: Oral Number of attempts: 1 Placement Confirmation: positive ETCO2 and breath sounds checked- equal and bilateral Tube secured with: Tape Dental Injury: Teeth and Oropharynx as per pre-operative assessment

## 2022-02-28 NOTE — Discharge Instructions (Signed)
 Dr. Frank Aluisio Total Joint Specialist [facility] [address] [place], [place] [address] [phone]   Arthroscopic Procedure, Knee An arthroscopic procedure can find what is wrong with your knee. PROCEDURE Arthroscopy is a surgical technique that allows your orthopedic surgeon to diagnose and treat your knee injury with accuracy. They will look into your knee through a small instrument. This is almost like a small (pencil sized) telescope. Because arthroscopy affects your knee less than open knee surgery, you can anticipate a more rapid recovery. Taking an active role by following your caregiver's instructions will help with rapid and complete recovery. Use crutches, rest, elevation, ice, and knee exercises as instructed. The length of recovery depends on various factors including type of injury, age, physical condition, medical conditions, and your rehabilitation. Your knee is the joint between the large bones (femur and tibia) in your leg. Cartilage covers these bone ends which are smooth and slippery and allow your knee to bend and move smoothly. Two menisci, thick, semi-lunar shaped pads of cartilage which form a rim inside the joint, help absorb shock and stabilize your knee. Ligaments bind the bones together and support your knee joint. Muscles move the joint, help support your knee, and take stress off the joint itself. Because of this all programs and physical therapy to rehabilitate an injured or repaired knee require rebuilding and strengthening your muscles. AFTER THE PROCEDURE  After the procedure, you will be moved to a recovery area until most of the effects of the medication have worn off. Your caregiver will discuss the test results with you.   Only take over-the-counter or prescription medicines for pain, discomfort, or fever as directed by your caregiver.  SEEK MEDICAL CARE IF:   You have increased bleeding from your wounds.   You see redness,  swelling, or have increasing pain in your wounds.   You have pus coming from your wound.   You have an oral temperature above 102 F (38.9 C).   You notice a bad smell coming from the wound or dressing.   You have severe pain with any motion of your knee.  SEEK IMMEDIATE MEDICAL CARE IF:   You develop a rash.   You have difficulty breathing.   You have any allergic problems.  FURTHER INSTRUCTIONS:   ICE to the affected knee every three hours for 30 minutes at a time and then as needed for pain and swelling.  Continue to use ice on the knee for pain and swelling from surgery. You may notice swelling that will progress down to the foot and ankle.  This is normal after surgery.  Elevate the leg when you are not up walking on it.    DIET You may resume your previous home diet once your are discharged from the hospital.  DRESSING / WOUND CARE / SHOWERING You may shower 3 days after surgery, but keep the wounds dry during showering.  You may use an occlusive plastic wrap (Press'n Seal for example), NO SOAKING/SUBMERGING IN THE BATHTUB.  If the bandage gets wet, change with a clean dry gauze.  If the incision gets wet, pat the wound dry with a clean towel. You may start showering two days after being discharged home but do not submerge the incisions under water.  Change dressing 48 hours after the procedure and then cover the small incisions with band aids until your follow up visit. Change the surgical dressings daily and reapply a dry dressing each time.   ACTIVITY   Walk with your walker as instructed. Use walker as long as suggested by your caregivers. Avoid periods of inactivity such as sitting longer than an hour when not asleep. This helps prevent blood clots.  You may resume a sexual relationship in one month or when given the OK by your doctor.  You may return to work once you are cleared by your doctor.  Do not drive a car for 6 weeks or until released by you surgeon.  Do not  drive while taking narcotics.  WEIGHT BEARING Weight bearing as tolerated with assist device (walker, cane, etc) as directed, use it as long as suggested by your surgeon or therapist, typically at least 4-6 weeks.  POSTOPERATIVE CONSTIPATION PROTOCOL Constipation - defined medically as fewer than three stools per week and severe constipation as less than one stool per week.  One of the most common issues patients have following surgery is constipation.  Even if you have a regular bowel pattern at home, your normal regimen is likely to be disrupted due to multiple reasons following surgery.  Combination of anesthesia, postoperative narcotics, change in appetite and fluid intake all can affect your bowels.  In order to avoid complications following surgery, here are some recommendations in order to help you during your recovery period.  Colace (docusate) - Pick up an over-the-counter form of Colace or another stool softener and take twice a day as long as you are requiring postoperative pain medications.  Take with a full glass of water daily.  If you experience loose stools or diarrhea, hold the colace until you stool forms back up.  If your symptoms do not get better within 1 week or if they get worse, check with your doctor.  Dulcolax (bisacodyl) - Pick up over-the-counter and take as directed by the product packaging as needed to assist with the movement of your bowels.  Take with a full glass of water.  Use this product as needed if not relieved by Colace only.   MiraLax (polyethylene glycol) - Pick up over-the-counter to have on hand.  MiraLax is a solution that will increase the amount of water in your bowels to assist with bowel movements.  Take as directed and can mix with a glass of water, juice, soda, coffee, or tea.  Take if you go more than two days without a movement. Do not use MiraLax more than once per day. Call your doctor if you are still constipated or irregular after using this  medication for 7 days in a row.  If you continue to have problems with postoperative constipation, please contact the office for further assistance and recommendations.  If you experience "the worst abdominal pain ever" or develop nausea or vomiting, please contact the office immediatly for further recommendations for treatment.  ITCHING  If you experience itching with your medications, try taking only a single pain pill, or even half a pain pill at a time.  You can also use Benadryl over the counter for itching or also to help with sleep.   TED HOSE STOCKINGS Wear the elastic stockings on both legs for three weeks following surgery during the day but you may remove then at night for sleeping.  MEDICATIONS See your medication summary on the "After Visit Summary" that the nursing staff will review with you prior to discharge.  You may have some home medications which will be placed on hold until you complete the course of blood thinner medication.  It is important for you to complete the   blood thinner medication as prescribed by your surgeon.  Continue your approved medications as instructed at time of discharge. Do not drive while taking narcotics.   PRECAUTIONS If you experience chest pain or shortness of breath - call 911 immediately for transfer to the hospital emergency department.  If you develop a fever greater that 101 F, purulent drainage from wound, increased redness or drainage from wound, foul odor from the wound/dressing, or calf pain - CONTACT YOUR SURGEON.                                                   FOLLOW-UP APPOINTMENTS Make sure you keep all of your appointments after your operation with your surgeon and caregivers. You should call the office at [phone]  and make an appointment for approximately one week after the date of your surgery or on the date instructed by your surgeon outlined in the "After Visit Summary".  RANGE OF MOTION AND STRENGTHENING EXERCISES   Rehabilitation of the knee is important following a knee injury or an operation. After just a few days of immobilization, the muscles of the thigh which control the knee become weakened and shrink (atrophy). Knee exercises are designed to build up the tone and strength of the thigh muscles and to improve knee motion. Often times heat used for twenty to thirty minutes before working out will loosen up your tissues and help with improving the range of motion but do not use heat for the first two weeks following surgery. These exercises can be done on a training (exercise) mat, on the floor, on a table or on a bed. Use what ever works the best and is most comfortable for you Knee exercises include:  QUAD STRENGTHENING EXERCISES Strengthening Quadriceps Sets  Tighten muscles on top of thigh by pushing knees down into floor or table. Hold for 20 seconds. Repeat 10 times. Do 2 sessions per day.     Strengthening Terminal Knee Extension  With knee bent over bolster, straighten knee by tightening muscle on top of thigh. Be sure to keep bottom of knee on bolster. Hold for 20 seconds. Repeat 10 times. Do 2 sessions per day.   Straight Leg with Bent Knee  Lie on back with opposite leg bent. Keep involved knee slightly bent at knee and raise leg 4-6". Hold for 10 seconds. Repeat 20 times per set. Do 2 sets per session. Do 2 sessions per day.   
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
## 2022-03-01 ENCOUNTER — Encounter (HOSPITAL_COMMUNITY): Payer: Self-pay | Admitting: Orthopedic Surgery

## 2022-03-01 NOTE — Anesthesia Preprocedure Evaluation (Signed)
Anesthesia Evaluation  Patient identified by MRN, date of birth, ID band Patient awake    Reviewed: Allergy & Precautions, NPO status , Patient's Chart, lab work & pertinent test results  History of Anesthesia Complications Negative for: history of anesthetic complications  Airway Mallampati: II  TM Distance: >3 FB Neck ROM: Full    Dental  (+) Dental Advisory Given   Pulmonary neg pulmonary ROS,    breath sounds clear to auscultation       Cardiovascular negative cardio ROS   Rhythm:Regular     Neuro/Psych negative neurological ROS  negative psych ROS   GI/Hepatic Neg liver ROS, hiatal hernia, GERD  ,  Endo/Other  negative endocrine ROS  Renal/GU negative Renal ROS     Musculoskeletal  (+) Arthritis , left knee medial meniscal tear   Abdominal   Peds  Hematology negative hematology ROS (+) Lab Results      Component                Value               Date                      WBC                      12.0 (H)            02/22/2022                HGB                      16.2                02/22/2022                HCT                      49.1                02/22/2022                MCV                      88.8                02/22/2022                PLT                      260                 02/22/2022              Anesthesia Other Findings   Reproductive/Obstetrics                             Anesthesia Physical Anesthesia Plan  ASA: 1  Anesthesia Plan: General   Post-op Pain Management:    Induction: Intravenous  PONV Risk Score and Plan: 2 and Ondansetron and Dexamethasone  Airway Management Planned: LMA  Additional Equipment: None  Intra-op Plan:   Post-operative Plan: Extubation in OR  Informed Consent: I have reviewed the patients History and Physical, chart, labs and discussed the procedure including the risks, benefits and alternatives for the proposed  anesthesia with the patient or authorized representative who has indicated his/her  understanding and acceptance.     Dental advisory given  Plan Discussed with: CRNA  Anesthesia Plan Comments:         Anesthesia Quick Evaluation

## 2022-03-01 NOTE — Anesthesia Postprocedure Evaluation (Signed)
Anesthesia Post Note  Patient: HIEP OLLIS  Procedure(s) Performed: Left knee arthroscopy; meniscal debridement (Left: Knee)     Patient location during evaluation: PACU Anesthesia Type: General Level of consciousness: awake and alert Pain management: pain level controlled Vital Signs Assessment: post-procedure vital signs reviewed and stable Respiratory status: spontaneous breathing, nonlabored ventilation and respiratory function stable Cardiovascular status: blood pressure returned to baseline and stable Postop Assessment: no apparent nausea or vomiting Anesthetic complications: no   No notable events documented.  Last Vitals:  Vitals:   02/28/22 1645 02/28/22 1700  BP: 115/66 120/60  Pulse: 74 76  Resp: 11 14  Temp:  36.6 C  SpO2: 100% 99%    Last Pain:  Vitals:   02/28/22 1700  TempSrc:   PainSc: 0-No pain                 Mildred Bollard

## 2022-03-15 ENCOUNTER — Ambulatory Visit: Payer: PPO | Admitting: Gastroenterology

## 2022-05-16 ENCOUNTER — Ambulatory Visit: Payer: PPO | Admitting: Gastroenterology

## 2022-05-16 ENCOUNTER — Encounter: Payer: Self-pay | Admitting: Gastroenterology

## 2022-05-16 VITALS — BP 110/70 | HR 61 | Ht 70.0 in | Wt 165.0 lb

## 2022-05-16 DIAGNOSIS — R1310 Dysphagia, unspecified: Secondary | ICD-10-CM | POA: Diagnosis not present

## 2022-05-16 DIAGNOSIS — K222 Esophageal obstruction: Secondary | ICD-10-CM

## 2022-05-16 DIAGNOSIS — Z1211 Encounter for screening for malignant neoplasm of colon: Secondary | ICD-10-CM | POA: Diagnosis not present

## 2022-05-16 DIAGNOSIS — R140 Abdominal distension (gaseous): Secondary | ICD-10-CM

## 2022-05-16 MED ORDER — OMEPRAZOLE 20 MG PO CPDR: 20.0000 mg | DELAYED_RELEASE_CAPSULE | Freq: Every day | ORAL | 3 refills | Status: DC

## 2022-05-16 MED ORDER — NA SULFATE-K SULFATE-MG SULF 17.5-3.13-1.6 GM/177ML PO SOLN: 1.0000 | Freq: Once | ORAL | 0 refills | Status: AC

## 2022-05-16 NOTE — Patient Instructions (Addendum)
If you are age 69 or older, your body mass index should be between 23-30. Your Body mass index is 23.68 kg/m. If this is out of the aforementioned range listed, please consider follow up with your Primary Care Provider.  If you are age 70 or younger, your body mass index should be between 19-25. Your Body mass index is 23.68 kg/m. If this is out of the aformentioned range listed, please consider follow up with your Primary Care Provider.   ________________________________________________________   Antonio Daniel have been scheduled for an endoscopy and colonoscopy. Please follow the written instructions given to you at your visit today. Please pick up your prep supplies at the pharmacy within the next 1-3 days. If you use inhalers (even only as needed), please bring them with you on the day of your procedure.   We have sent the following medications to your pharmacy for you to pick up at your convenience: Omeprazole 20 mg: Take once daily  We are giving you a Low-FODMAP diet handout today. FODMAPs are short-chain carbohydrates (sugars) that are highly fermentable, which means that they go through chemical changes in the GI system, and are poorly absorbed during digestion. When FODMAPs reach the colon (large intestine), bacteria ferment these sugars, turning them into gas and chemicals. This stretches the walls of the colon, causing abdominal bloating, distension, cramping, pain, and/or changes in bowel habits in many patients with IBS. FODMAPs are not unhealthy or harmful, but may exacerbate GI symptoms in those with sensitive GI tracts.  Thank you for entrusting me with your care and for choosing [facility], Dr. Odin Cellar
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
## 2022-06-01 ENCOUNTER — Ambulatory Visit (AMBULATORY_SURGERY_CENTER): Payer: PPO | Admitting: Gastroenterology

## 2022-06-01 ENCOUNTER — Encounter: Payer: Self-pay | Admitting: Gastroenterology

## 2022-06-01 VITALS — BP 121/56 | HR 55 | Temp 98.0°F | Resp 15 | Ht 70.0 in | Wt 165.0 lb

## 2022-06-01 DIAGNOSIS — K3189 Other diseases of stomach and duodenum: Secondary | ICD-10-CM | POA: Diagnosis not present

## 2022-06-01 DIAGNOSIS — Z1211 Encounter for screening for malignant neoplasm of colon: Secondary | ICD-10-CM | POA: Diagnosis not present

## 2022-06-01 DIAGNOSIS — D125 Benign neoplasm of sigmoid colon: Secondary | ICD-10-CM | POA: Diagnosis not present

## 2022-06-01 DIAGNOSIS — R1310 Dysphagia, unspecified: Secondary | ICD-10-CM

## 2022-06-01 DIAGNOSIS — C7A024 Malignant carcinoid tumor of the descending colon: Secondary | ICD-10-CM

## 2022-06-01 DIAGNOSIS — K222 Esophageal obstruction: Secondary | ICD-10-CM | POA: Diagnosis not present

## 2022-06-01 DIAGNOSIS — K635 Polyp of colon: Secondary | ICD-10-CM | POA: Diagnosis not present

## 2022-06-01 DIAGNOSIS — D123 Benign neoplasm of transverse colon: Secondary | ICD-10-CM | POA: Diagnosis not present

## 2022-06-01 DIAGNOSIS — K449 Diaphragmatic hernia without obstruction or gangrene: Secondary | ICD-10-CM | POA: Diagnosis not present

## 2022-06-01 DIAGNOSIS — D3A010 Benign carcinoid tumor of the duodenum: Secondary | ICD-10-CM | POA: Diagnosis not present

## 2022-06-01 MED ORDER — SODIUM CHLORIDE 0.9 % IV SOLN: 500.0000 mL | INTRAVENOUS | Status: DC

## 2022-06-01 NOTE — Progress Notes (Signed)
Called to room to assist during endoscopic procedure.  Patient ID and intended procedure confirmed with present staff. Received instructions for my participation in the procedure from the performing physician.  

## 2022-06-01 NOTE — Progress Notes (Signed)
Sedate, gd SR, tolerated procedure well, VSS, report to RN 

## 2022-06-01 NOTE — Patient Instructions (Addendum)
3 polyps removed and sent to pathology.   Await results for final recommendations.  Handouts on findings given to patient.  (Polyps, diverticulosis, hemorrhoids, dilation diet)  - Post dilation diet - Continue present medications. - Await pathology results.   YOU HAD AN ENDOSCOPIC PROCEDURE TODAY AT [facility]:   Refer to the procedure report that was given to you for any specific questions about what was found during the examination.  If the procedure report does not answer your questions, please call your gastroenterologist to clarify.  If you requested that your care partner not be given the details of your procedure findings, then the procedure report has been included in a sealed envelope for you to review at your convenience later.  YOU SHOULD EXPECT: Some feelings of bloating in the abdomen. Passage of more gas than usual.  Walking can help get rid of the air that was put into your GI tract during the procedure and reduce the bloating. If you had a lower endoscopy (such as a colonoscopy or flexible sigmoidoscopy) you may notice spotting of blood in your stool or on the toilet paper. If you underwent a bowel prep for your procedure, you may not have a normal bowel movement for a few days.  Please Note:  You might notice some irritation and congestion in your nose or some drainage.  This is from the oxygen used during your procedure.  There is no need for concern and it should clear up in a day or so.  SYMPTOMS TO REPORT IMMEDIATELY:  Following lower endoscopy (colonoscopy or flexible sigmoidoscopy):  Excessive amounts of blood in the stool  Significant tenderness or worsening of abdominal pains  Swelling of the abdomen that is new, acute  Fever of 100F or higher  Following upper endoscopy (EGD)  Vomiting of blood or coffee ground material  New chest pain or pain under the shoulder blades  Painful or persistently difficult swallowing  New shortness of breath  Fever  of 100F or higher  Black, tarry-looking stools  For urgent or emergent issues, a gastroenterologist can be reached at any hour by calling [phone]. Do not use MyChart messaging for urgent concerns.    DIET:  We do recommend a small meal at first, but then you may proceed to your regular diet.  Drink plenty of fluids but you should avoid alcoholic beverages for 24 hours.  ACTIVITY:  You should plan to take it easy for the rest of today and you should NOT DRIVE or use heavy machinery until tomorrow (because of the sedation medicines used during the test).    FOLLOW UP: Our staff will call the number listed on your records the next business day following your procedure.  We will call around 7:15- 8:00 am to check on you and address any questions or concerns that you may have regarding the information given to you following your procedure. If we do not reach you, we will leave a message.     If any biopsies were taken you will be contacted by phone or by letter within the next 1-3 weeks.  Please call us at [phone] if you have not heard about the biopsies in 3 weeks.    SIGNATURES/CONFIDENTIALITY: You and/or your care partner have signed paperwork which will be entered into your electronic medical record.  These signatures attest to the fact that that the information above on your After Visit Summary has been reviewed and is understood.  Full responsibility of the  confidentiality of this discharge information lies with you and/or your care-partner. 

## 2022-06-01 NOTE — Op Note (Signed)
[facility] Patient Name: Antonio Daniel Procedure Date: 06/01/2022 10:21 AM MRN: [id number] Endoscopist: Remo Lipps P. Havery Moros , MD, [id number] Age: 71 Referring MD:  Date of Birth: 06-29-1951 Gender: Male Account #: [id number] Procedure:                Colonoscopy Indications:              Screening for colorectal malignant neoplasm Medicines:                Monitored Anesthesia Care Procedure:                Pre-Anesthesia Assessment:                           - Prior to the procedure, a History and Physical                            was performed, and patient medications and                            allergies were reviewed. The patient's tolerance of                            previous anesthesia was also reviewed. The risks                            and benefits of the procedure and the sedation                            options and risks were discussed with the patient.                            All questions were answered, and informed consent                            was obtained. Prior Anticoagulants: The patient has                            taken no anticoagulant or antiplatelet agents. ASA                            Grade Assessment: II - A patient with mild systemic                            disease. After reviewing the risks and benefits,                            the patient was deemed in satisfactory condition to                            undergo the procedure.                           After obtaining informed consent, the colonoscope  was passed under direct vision. Throughout the                            procedure, the patient's blood pressure, pulse, and                            oxygen saturations were monitored continuously. The                            CF [id number] #[id number] was introduced through the anus                            and advanced to the the cecum, identified by                             appendiceal orifice and ileocecal valve. The                            colonoscopy was performed without difficulty. The                            patient tolerated the procedure well. The quality                            of the bowel preparation was good. The ileocecal                            valve, appendiceal orifice, and rectum were                            photographed. Scope In: 10:45:49 AM Scope Out: 11:07:17 AM Scope Withdrawal Time: 0 hours 18 minutes 15 seconds  Total Procedure Duration: 0 hours 21 minutes 28 seconds  Findings:                 The perianal and digital rectal examinations were                            normal.                           A 3 mm polyp was found in the transverse colon. The                            polyp was flat. The polyp was removed with a cold                            snare. Resection and retrieval were complete.                           A 4 mm polyp was found in the transverse colon. The                            polyp was sessile. The polyp was removed with  a                            cold snare. Resection and retrieval were complete.                           A 3 mm polyp was found in the sigmoid colon. The                            polyp was sessile. The polyp was removed with a                            cold snare. Resection and retrieval were complete.                           A few small-mouthed diverticula were found in the                            sigmoid colon.                           Internal hemorrhoids were found during                            retroflexion. The hemorrhoids were small.                           The exam was otherwise without abnormality. Complications:            No immediate complications. Estimated blood loss:                            Minimal. Estimated Blood Loss:     Estimated blood loss was minimal. Impression:               - One 3 mm polyp in the transverse colon, removed                             with a cold snare. Resected and retrieved.                           - One 4 mm polyp in the transverse colon, removed                            with a cold snare. Resected and retrieved.                           - One 3 mm polyp in the sigmoid colon, removed with                            a cold snare. Resected and retrieved.                           - Diverticulosis in the sigmoid colon.                           -  Internal hemorrhoids.                           - The examination was otherwise normal. Recommendation:           - Patient has a contact number available for                            emergencies. The signs and symptoms of potential                            delayed complications were discussed with the                            patient. Return to normal activities tomorrow.                            Written discharge instructions were provided to the                            patient.                           - Resume previous diet.                           - Continue present medications.                           - Await pathology results. Remo Lipps P. Lunetta Marina, MD 06/01/2022 11:13:35 AM This report has been signed electronically.

## 2022-06-01 NOTE — Progress Notes (Signed)
History and Physical Interval Note: Patient seen on 05/16/22 - see clinic note for details of his case. No interval changes. EGD to further evaluate symptoms of dysphagia, he has a history of Shatski ring that has responded to dilation in the past. Colonoscopy for screening, last exam in 2009. He wishes to proceed no interval changes. Now on omeprazole.   06/01/2022 10:25 AM  Antonio Daniel  has presented today for endoscopic procedure(s), with the diagnosis of  Encounter Diagnoses  Name Primary?   Dysphagia, unspecified type Yes   Colon cancer screening   .  The various methods of evaluation and treatment have been discussed with the patient and/or family. After consideration of risks, benefits and other options for treatment, the patient has consented to  the endoscopic procedure(s).   The patient's history has been reviewed, patient examined, no change in status, stable for surgery.  I have reviewed the patient's chart and labs.  Questions were answered to the patient's satisfaction.    Jolly Mango, MD [facility]

## 2022-06-01 NOTE — Op Note (Signed)
Antonio Daniel: Antonio Daniel Procedure Date: 06/01/2022 10:28 AM MRN: [id number] Endoscopist: Remo Lipps P. Havery Moros , MD, [id number] Age: 71 Referring MD:  Date of Birth: February 15, 1951 Gender: Male Account #: [id number] Procedure:                Upper GI endoscopy Indications:              Dysphagia - history of esophageal stricture Medicines:                Monitored Anesthesia Care Procedure:                Pre-Anesthesia Assessment:                           - Prior to the procedure, a History and Physical                            was performed, and patient medications and                            allergies were reviewed. The patient's tolerance of                            previous anesthesia was also reviewed. The risks                            and benefits of the procedure and the sedation                            options and risks were discussed with the patient.                            All questions were answered, and informed consent                            was obtained. Prior Anticoagulants: The patient has                            taken no anticoagulant or antiplatelet agents. ASA                            Grade Assessment: II - A patient with mild systemic                            disease. After reviewing the risks and benefits,                            the patient was deemed in satisfactory condition to                            undergo the procedure.                           After obtaining informed consent, the endoscope was  passed under direct vision. Throughout the                            procedure, the patient's blood pressure, pulse, and                            oxygen saturations were monitored continuously. The                            Endoscope was introduced through the mouth, and                            advanced to the second part of duodenum. The upper                            GI  endoscopy was accomplished without difficulty.                            The patient tolerated the procedure well. Scope In: Scope Out: Findings:                 Esophagogastric landmarks were identified: the                            Z-line was found at 35 cm, the gastroesophageal                            junction was found at 35 cm and the upper extent of                            the gastric folds was found at 38 cm from the                            incisors.                           A 3 cm hiatal hernia was present.                           One benign-appearing, intrinsic mild stenosis was                            found. This stenosis measured less than one cm (in                            length). A TTS dilator was passed through the                            scope. Dilation with a 18 mm balloon dilator was                            performed to 17 mm and then 18 mm after which  multiple appropriate mucosal wrents were noted.                           The exam of the esophagus was otherwise normal.                           The entire examined stomach was normal.                           A single subepithelial nodule was found in the                            duodenal bulb. Bite on bite biopsies were taken                            with a cold forceps for histology.                           The exam of the duodenum was otherwise normal. Complications:            No immediate complications. Estimated blood loss:                            Minimal. Estimated Blood Loss:     Estimated blood loss was minimal. Impression:               - Esophagogastric landmarks identified.                           - 3 cm hiatal hernia.                           - Benign-appearing esophageal stenosis. Dilated to                            50m with a good result                           - Normal stomach.                           - Benign appearing  subepithelial nodule found in                            the duodenum. Biopsied.                           - Normal duodenum otherwise. Recommendation:           - Patient has a contact number available for                            emergencies. The signs and symptoms of potential                            delayed complications were discussed with the  patient. Return to normal activities tomorrow.                            Written discharge instructions were provided to the                            patient.                           - Post dilation diet                           - Continue present medications.                           - Await pathology results. Remo Lipps P. Mardell Suttles, MD 06/01/2022 11:22:02 AM This report has been signed electronically.

## 2022-06-02 ENCOUNTER — Telehealth: Payer: Self-pay

## 2022-06-02 NOTE — Telephone Encounter (Signed)
  Follow up Call-     06/01/2022   10:01 AM  Call back number  Post procedure Call Back phone  # [phone]  Permission to leave phone message Yes     Patient questions:  Do you have a fever, pain , or abdominal swelling? No. Pain Score  0 *  Have you tolerated food without any problems? Yes.    Have you been able to return to your normal activities? Yes.    Do you have any questions about your discharge instructions: Diet   No. Medications  No. Follow up visit  No.  Do you have questions or concerns about your Care? No.  Actions: * If pain score is 4 or above: No action needed, pain <4.

## 2022-06-06 ENCOUNTER — Other Ambulatory Visit: Payer: Self-pay

## 2022-06-06 DIAGNOSIS — E340 Carcinoid syndrome: Secondary | ICD-10-CM

## 2022-06-07 ENCOUNTER — Other Ambulatory Visit: Payer: Self-pay

## 2022-06-07 ENCOUNTER — Telehealth: Payer: Self-pay

## 2022-06-07 DIAGNOSIS — E340 Carcinoid syndrome: Secondary | ICD-10-CM

## 2022-06-07 NOTE — Telephone Encounter (Signed)
EUS scheduled, pt instructed and medications reviewed.  Patient instructions mailed to home and sent to My Chart .  Patient to call with any questions or concerns.  

## 2022-06-07 NOTE — Telephone Encounter (Signed)
EUS EMR has been scheduled for 07/28/22 at [facility] with GM at 1 pm  Left message on machine to call back

## 2022-06-07 NOTE — Telephone Encounter (Signed)
-----   Message from Irving Copas., MD sent at 06/07/2022  4:46 AM EST ----- Regarding: RE: duodenal carcinoid SA, Agree this patient needs EGD/EUS with EMR.  Rozetta Stumpp, Please move forward with scheduling this patient for EUS/EMR 75-minute case in January or February so that we have the opportunity to get all of his scans and labs done. Thanks. 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  Not very big.  Have told him about this and we are obtaining a CT scan for staging and chromogranin A level.  I will await to get this back but anticipate he will need EUS and removal at the hospital with you at some point, pending imaging findings.  We will relay results of his imaging when it is done.  Thank you  Richardson Landry

## 2022-06-23 DIAGNOSIS — H25811 Combined forms of age-related cataract, right eye: Secondary | ICD-10-CM | POA: Diagnosis not present

## 2022-06-24 ENCOUNTER — Ambulatory Visit (HOSPITAL_COMMUNITY)
Admission: RE | Admit: 2022-06-24 | Discharge: 2022-06-24 | Disposition: A | Discharge status: Home / Self Care | Payer: PPO | Source: Ambulatory Visit | Attending: Gastroenterology | Admitting: Gastroenterology

## 2022-06-24 DIAGNOSIS — E340 Carcinoid syndrome: Secondary | ICD-10-CM | POA: Diagnosis not present

## 2022-06-24 DIAGNOSIS — S22080A Wedge compression fracture of T11-T12 vertebra, initial encounter for closed fracture: Secondary | ICD-10-CM | POA: Diagnosis not present

## 2022-06-24 DIAGNOSIS — K5730 Diverticulosis of large intestine without perforation or abscess without bleeding: Secondary | ICD-10-CM | POA: Diagnosis not present

## 2022-06-24 DIAGNOSIS — K3189 Other diseases of stomach and duodenum: Secondary | ICD-10-CM | POA: Diagnosis not present

## 2022-06-24 DIAGNOSIS — C7A010 Malignant carcinoid tumor of the duodenum: Secondary | ICD-10-CM | POA: Diagnosis not present

## 2022-06-24 MED ORDER — SODIUM CHLORIDE (PF) 0.9 % IJ SOLN
INTRAMUSCULAR | Status: AC
  Filled 2022-06-24: qty 50

## 2022-06-24 MED ORDER — IOHEXOL 300 MG/ML  SOLN
100.0000 mL | Freq: Once | INTRAMUSCULAR | Status: AC | PRN
  Administered 2022-06-24: 100 mL via INTRAVENOUS

## 2022-07-05 ENCOUNTER — Other Ambulatory Visit (HOSPITAL_COMMUNITY)
Admission: RE | Admit: 2022-07-05 | Discharge: 2022-07-05 | Disposition: A | Discharge status: Home / Self Care | Payer: PPO | Source: Ambulatory Visit | Attending: Gastroenterology | Admitting: Gastroenterology

## 2022-07-05 DIAGNOSIS — E340 Carcinoid syndrome: Secondary | ICD-10-CM | POA: Insufficient documentation

## 2022-07-07 LAB — CHROMOGRANIN A: Chromogranin A (ng/mL): 130.9 ng/mL — ABNORMAL HIGH (ref 0.0–101.8)

## 2022-07-21 ENCOUNTER — Encounter (HOSPITAL_COMMUNITY): Payer: Self-pay | Admitting: Gastroenterology

## 2022-07-28 ENCOUNTER — Encounter (HOSPITAL_COMMUNITY): Payer: Self-pay | Admitting: Gastroenterology

## 2022-07-28 ENCOUNTER — Ambulatory Visit (HOSPITAL_BASED_OUTPATIENT_CLINIC_OR_DEPARTMENT_OTHER): Payer: PPO | Admitting: Anesthesiology

## 2022-07-28 ENCOUNTER — Other Ambulatory Visit: Payer: Self-pay

## 2022-07-28 ENCOUNTER — Ambulatory Visit (HOSPITAL_COMMUNITY)
Admission: RE | Admit: 2022-07-28 | Discharge: 2022-07-28 | Disposition: A | Discharge status: Home / Self Care | Payer: PPO | Source: Ambulatory Visit | Attending: Gastroenterology | Admitting: Gastroenterology

## 2022-07-28 ENCOUNTER — Ambulatory Visit (HOSPITAL_COMMUNITY): Payer: PPO | Admitting: Anesthesiology

## 2022-07-28 ENCOUNTER — Encounter (HOSPITAL_COMMUNITY)
Admission: RE | Disposition: A | Discharge status: Home / Self Care | Payer: Self-pay | Source: Ambulatory Visit | Attending: Gastroenterology

## 2022-07-28 DIAGNOSIS — K317 Polyp of stomach and duodenum: Secondary | ICD-10-CM

## 2022-07-28 DIAGNOSIS — K449 Diaphragmatic hernia without obstruction or gangrene: Secondary | ICD-10-CM | POA: Diagnosis not present

## 2022-07-28 DIAGNOSIS — K2289 Other specified disease of esophagus: Secondary | ICD-10-CM | POA: Insufficient documentation

## 2022-07-28 DIAGNOSIS — D630 Anemia in neoplastic disease: Secondary | ICD-10-CM | POA: Diagnosis not present

## 2022-07-28 DIAGNOSIS — K219 Gastro-esophageal reflux disease without esophagitis: Secondary | ICD-10-CM | POA: Insufficient documentation

## 2022-07-28 DIAGNOSIS — Z8674 Personal history of sudden cardiac arrest: Secondary | ICD-10-CM | POA: Insufficient documentation

## 2022-07-28 DIAGNOSIS — E340 Carcinoid syndrome: Secondary | ICD-10-CM

## 2022-07-28 DIAGNOSIS — E063 Autoimmune thyroiditis: Secondary | ICD-10-CM | POA: Diagnosis not present

## 2022-07-28 DIAGNOSIS — K222 Esophageal obstruction: Secondary | ICD-10-CM | POA: Diagnosis not present

## 2022-07-28 DIAGNOSIS — M1990 Unspecified osteoarthritis, unspecified site: Secondary | ICD-10-CM | POA: Diagnosis not present

## 2022-07-28 DIAGNOSIS — C7A1 Malignant poorly differentiated neuroendocrine tumors: Secondary | ICD-10-CM | POA: Diagnosis not present

## 2022-07-28 DIAGNOSIS — K3189 Other diseases of stomach and duodenum: Secondary | ICD-10-CM

## 2022-07-28 DIAGNOSIS — I899 Noninfective disorder of lymphatic vessels and lymph nodes, unspecified: Secondary | ICD-10-CM | POA: Diagnosis not present

## 2022-07-28 DIAGNOSIS — C7A010 Malignant carcinoid tumor of the duodenum: Secondary | ICD-10-CM | POA: Diagnosis not present

## 2022-07-28 DIAGNOSIS — D3A010 Benign carcinoid tumor of the duodenum: Secondary | ICD-10-CM | POA: Diagnosis not present

## 2022-07-28 DIAGNOSIS — K209 Esophagitis, unspecified without bleeding: Secondary | ICD-10-CM | POA: Diagnosis not present

## 2022-07-28 HISTORY — PX: EUS: SHX5427

## 2022-07-28 HISTORY — PX: HEMOSTASIS CLIP PLACEMENT: SHX6857

## 2022-07-28 HISTORY — PX: SUBMUCOSAL LIFTING INJECTION: SHX6855

## 2022-07-28 HISTORY — PX: ESOPHAGOGASTRODUODENOSCOPY (EGD) WITH PROPOFOL: SHX5813

## 2022-07-28 HISTORY — PX: ENDOSCOPIC MUCOSAL RESECTION: SHX6839

## 2022-07-28 SURGERY — UPPER ENDOSCOPIC ULTRASOUND (EUS) RADIAL
Anesthesia: Monitor Anesthesia Care

## 2022-07-28 MED ORDER — LACTATED RINGERS IV SOLN: INTRAVENOUS | Status: DC

## 2022-07-28 MED ORDER — OMEPRAZOLE 20 MG PO CPDR: 20.0000 mg | DELAYED_RELEASE_CAPSULE | Freq: Two times a day (BID) | ORAL | 6 refills | Status: DC

## 2022-07-28 MED ORDER — PROPOFOL 500 MG/50ML IV EMUL
INTRAVENOUS | Status: DC | PRN
  Administered 2022-07-28: 120 ug/kg/min via INTRAVENOUS

## 2022-07-28 MED ORDER — PROPOFOL 1000 MG/100ML IV EMUL
INTRAVENOUS | Status: AC
  Filled 2022-07-28: qty 100

## 2022-07-28 MED ORDER — SODIUM CHLORIDE 0.9 % IV SOLN: INTRAVENOUS | Status: DC

## 2022-07-28 MED ORDER — SUCRALFATE 1 G PO TABS: 1.0000 g | ORAL_TABLET | Freq: Two times a day (BID) | ORAL | 0 refills | Status: DC

## 2022-07-28 MED ORDER — IBUPROFEN 800 MG PO TABS: 800.0000 mg | ORAL_TABLET | Freq: Three times a day (TID) | ORAL | 0 refills | Status: DC | PRN
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
## 2022-07-28 NOTE — Op Note (Signed)
[facility] Patient Name: Antonio Daniel Procedure Date: 07/28/2022 MRN: [id number] Attending MD: Justice Britain , MD, [id number] Date of Birth: [birth year] CSN: [id number] Age: 72 Admit Type: Outpatient Procedure:                Upper EUS Indications:              Duodenal deformity on endoscopy/Subepithelial tumor                            vs. extrinsic compression, Neuroendocrine Tumor Providers:                Justice Britain, MD, Kary Kos RN, RN, Cletis Athens, Technician Referring MD:             Carlota Raspberry. Havery Moros, MD Medicines:                Monitored Anesthesia Care Complications:            No immediate complications. Estimated Blood Loss:     Estimated blood loss was minimal. Procedure:                Pre-Anesthesia Assessment:                           - Prior to the procedure, a History and Physical                            was performed, and patient medications and                            allergies were reviewed. The patient's tolerance of                            previous anesthesia was also reviewed. The risks                            and benefits of the procedure and the sedation                            options and risks were discussed with the patient.                            All questions were answered, and informed consent                            was obtained. Prior Anticoagulants: The patient has                            taken no anticoagulant or antiplatelet agents                            except for NSAID medication. ASA Grade Assessment:  II - A patient with mild systemic disease. After                            reviewing the risks and benefits, the patient was                            deemed in satisfactory condition to undergo the                            procedure.                           After obtaining informed consent, the endoscope was                             passed under direct vision. Throughout the                            procedure, the patient's blood pressure, pulse, and                            oxygen saturations were monitored continuously. The                            [id number] ([id number]) Olympus therapeutic endoscope                            was introduced through the mouth, and advanced to                            the second part of duodenum. The [id number]-[id number]                            ([id number]) Olympus radial ultrasound scope was                            introduced through the mouth, and advanced to the                            duodenum for ultrasound examination from the                            stomach and duodenum. The upper EUS was                            accomplished without difficulty. The patient                            tolerated the procedure. Scope In: Scope Out: Findings:      ENDOSCOPIC FINDING: :      No gross lesions were noted in the entire esophagus.      A widely patent Schatzki ring was found at the gastroesophageal junction.      The Z-line was irregular and was found 38 cm from the incisors.  A 3 cm hiatal hernia was present.      No gross lesions were noted in the entire examined stomach.      A single 10 mm subepithelial nodule was found in the duodenal bulb       (within 2 to 3 mm of the pylorus). After the EUS was completed,       preparations were made for attempt at mucosal resection. Demarcation of       the lesion was performed with high-definition white light and narrow       band imaging to clearly identify the boundaries of the lesion. EverLift       was injected to raise the lesion. Band ligator and snare mucosal       resection was performed. Resection and retrieval were complete. Resected       tissue margins were examined and clear of polyp tissue. To prevent       bleeding after mucosal resection, three hemostatic clips were       successfully placed  (MR conditional). Clip manufacturer: [employer]. There was no bleeding during, or at the end, of the       procedure.      No gross lesions were noted in the duodenal bulb, in the first portion       of the duodenum and in the second portion of the duodenum.      ENDOSONOGRAPHIC FINDING: :      An oval intramural (subepithelial) lesion was found in the duodenal       bulb. The lesion was hypoechoic. Endosonographically, the lesion       appeared to originate from within the deep mucosa (Layer 2). The lesion       measured 10 mm (in maximum thickness). The lesion also measured 6 mm in       diameter. The outer margins were well defined.      No malignant-appearing lymph nodes were visualized in the gastrohepatic       ligament (level 18), celiac region (level 20) and perigastric region.      Endosonographic imaging in the visualized portion of the liver showed no       mass.      The celiac region was visualized. Impression:               EGD impression:                           - No gross lesions in the entire esophagus. Widely                            patent Schatzki ring. Z-line irregular, 38 cm from                            the incisors.                           - 3 cm hiatal hernia.                           - No gross lesions in the entire stomach.                           -  Subepithelial nodule found in the duodenum. After                            EUS was completed, complete removal was                            accomplished via Cap-Band-EMR resection. Clips (MR                            conditional) were placed. Clip manufacturer: [employer].                           - No other gross lesions in the duodenal bulb, in                            the first portion of the duodenum and in the second                            portion of the duodenum.                           EUS Impression:                           - An  intramural (subepithelial) lesion was found in                            the duodenal bulb. The lesion appeared to originate                            from within the deep mucosa (Layer 2). A tissue                            diagnosis was obtained prior to this exam. This is                            consistent with a neuroendocrine tumor.                           - No malignant-appearing lymph nodes were                            visualized in the gastrohepatic ligament (level                            18), celiac region (level 20) and perigastric                            region. Moderate Sedation:      Not Applicable - Patient had care per Anesthesia. Recommendation:           - The patient will be observed post-procedure,  until all discharge criteria are met.                           - Discharge patient to home.                           - Patient has a contact number available for                            emergencies. The signs and symptoms of potential                            delayed complications were discussed with the                            patient. Return to normal activities tomorrow.                            Written discharge instructions were provided to the                            patient.                           - Full liquid diet rest of today and then soft diet                            thereafter tomorrow.                           - Observe patient's clinical course.                           - Await path results.                           - Restart omeprazole at 20 mg twice daily for 1                            month then 20 mg once daily for 1 month.                           - Carafate 1 g twice daily for 2 weeks.                           - The findings and recommendations were discussed                            with the patient.                           - The findings and recommendations were discussed                             with the patient's family. Procedure Code(s):        ---  Professional ---                           [id number], Esophagogastroduodenoscopy, flexible,                            transoral; with endoscopic mucosal resection                           43237, Esophagogastroduodenoscopy, flexible,                            transoral; with endoscopic ultrasound examination                            limited to the esophagus, stomach or duodenum, and                            adjacent structures Diagnosis Code(s):        --- Professional ---                           K22.2, Esophageal obstruction                           K22.89, Other specified disease of esophagus                           K44.9, Diaphragmatic hernia without obstruction or                            gangrene                           K31.89, Other diseases of stomach and duodenum                           I89.9, Noninfective disorder of lymphatic vessels                            and lymph nodes, unspecified CPT copyright 2022 American Medical Association. All rights reserved. The codes documented in this report are preliminary and upon coder review may  be revised to meet current compliance requirements. Justice Britain, MD 07/28/2022 3:49:56 PM Number of Addenda: 0

## 2022-07-28 NOTE — Anesthesia Postprocedure Evaluation (Signed)
Anesthesia Post Note  Patient: Antonio Daniel  Procedure(s) Performed: UPPER ENDOSCOPIC ULTRASOUND (EUS) RADIAL ENDOSCOPIC MUCOSAL RESECTION SUBMUCOSAL LIFTING INJECTION HEMOSTASIS CLIP PLACEMENT     Patient location during evaluation: PACU Anesthesia Type: MAC Level of consciousness: awake Pain management: pain level controlled Vital Signs Assessment: post-procedure vital signs reviewed and stable Respiratory status: spontaneous breathing, nonlabored ventilation and respiratory function stable Cardiovascular status: stable and blood pressure returned to baseline Postop Assessment: no apparent nausea or vomiting Anesthetic complications: no   No notable events documented.  Last Vitals:  Vitals:   07/28/22 1559 07/28/22 1600  BP:  123/79  Pulse:    Resp: 18 18  Temp:    SpO2:      Last Pain:  Vitals:   07/28/22 1547  TempSrc: Temporal  PainSc: 0-No pain                 Nilda Simmer

## 2022-07-28 NOTE — Discharge Instructions (Signed)
YOU HAD AN ENDOSCOPIC PROCEDURE TODAY: Refer to the procedure report and other information in the discharge instructions given to you for any specific questions about what was found during the examination. If this information does not answer your questions, please call [place] office at [phone] to clarify.  ° °YOU SHOULD EXPECT: Some feelings of bloating in the abdomen. Passage of more gas than usual. Walking can help get rid of the air that was put into your GI tract during the procedure and reduce the bloating. If you had a lower endoscopy (such as a colonoscopy or flexible sigmoidoscopy) you may notice spotting of blood in your stool or on the toilet paper. Some abdominal soreness may be present for a day or two, also. ° °DIET: Your first meal following the procedure should be a light meal and then it is ok to progress to your normal diet. A half-sandwich or bowl of soup is an example of a good first meal. Heavy or fried foods are harder to digest and may make you feel nauseous or bloated. Drink plenty of fluids but you should avoid alcoholic beverages for 24 hours. If you had a esophageal dilation, please see attached instructions for diet.   ° °ACTIVITY: Your care partner should take you home directly after the procedure. You should plan to take it easy, moving slowly for the rest of the day. You can resume normal activity the day after the procedure however YOU SHOULD NOT DRIVE, use power tools, machinery or perform tasks that involve climbing or major physical exertion for 24 hours (because of the sedation medicines used during the test).  ° °SYMPTOMS TO REPORT IMMEDIATELY: °A gastroenterologist can be reached at any hour. Please call [phone]  for any of the following symptoms:  °Following lower endoscopy (colonoscopy, flexible sigmoidoscopy) °Excessive amounts of blood in the stool  °Significant tenderness, worsening of abdominal pains  °Swelling of the abdomen that is new, acute  °Fever of 100° or  higher  °Following upper endoscopy (EGD, EUS, ERCP, esophageal dilation) °Vomiting of blood or coffee ground material  °New, significant abdominal pain  °New, significant chest pain or pain under the shoulder blades  °Painful or persistently difficult swallowing  °New shortness of breath  °Black, tarry-looking or red, bloody stools ° °FOLLOW UP:  °If any biopsies were taken you will be contacted by phone or by letter within the next 1-3 weeks. Call [phone]  if you have not heard about the biopsies in 3 weeks.  °Please also call with any specific questions about appointments or follow up tests. ° °

## 2022-07-28 NOTE — Transfer of Care (Signed)
Immediate Anesthesia Transfer of Care Note  Patient: ROMMEL HOGSTON  Procedure(s) Performed: UPPER ENDOSCOPIC ULTRASOUND (EUS) RADIAL ENDOSCOPIC MUCOSAL RESECTION SUBMUCOSAL LIFTING INJECTION HEMOSTASIS CLIP PLACEMENT  Patient Location: Short Stay  Anesthesia Type:MAC  Level of Consciousness: awake, alert , oriented, and patient cooperative  Airway & Oxygen Therapy: Patient Spontanous Breathing  Post-op Assessment: Report given to RN, Post -op Vital signs reviewed and stable, and Patient moving all extremities X 4  Post vital signs: Reviewed and stable  Last Vitals:  Vitals Value Taken Time  BP 116/71 07/28/22 1545  Temp    Pulse 55 07/28/22 1547  Resp 15 07/28/22 1547  SpO2 98 % 07/28/22 1547  Vitals shown include unvalidated device data.  Last Pain:  Vitals:   07/28/22 1156  TempSrc: Tympanic  PainSc: 0-No pain         Complications: No notable events documented.

## 2022-07-31 ENCOUNTER — Encounter (HOSPITAL_COMMUNITY): Payer: Self-pay | Admitting: Gastroenterology

## 2022-08-03 DIAGNOSIS — H25811 Combined forms of age-related cataract, right eye: Secondary | ICD-10-CM | POA: Diagnosis not present

## 2022-08-05 ENCOUNTER — Encounter: Payer: Self-pay | Admitting: Gastroenterology

## 2022-08-05 LAB — SURGICAL PATHOLOGY

## 2022-08-26 DIAGNOSIS — H25811 Combined forms of age-related cataract, right eye: Secondary | ICD-10-CM | POA: Diagnosis not present

## 2022-08-26 DIAGNOSIS — H269 Unspecified cataract: Secondary | ICD-10-CM | POA: Diagnosis not present

## 2022-09-09 DIAGNOSIS — H2512 Age-related nuclear cataract, left eye: Secondary | ICD-10-CM | POA: Diagnosis not present

## 2022-09-09 DIAGNOSIS — H269 Unspecified cataract: Secondary | ICD-10-CM | POA: Diagnosis not present

## 2022-09-29 DIAGNOSIS — D509 Iron deficiency anemia, unspecified: Secondary | ICD-10-CM | POA: Diagnosis not present

## 2022-09-29 DIAGNOSIS — G9332 Myalgic encephalomyelitis/chronic fatigue syndrome: Secondary | ICD-10-CM | POA: Diagnosis not present

## 2022-09-29 DIAGNOSIS — Z6825 Body mass index (BMI) 25.0-25.9, adult: Secondary | ICD-10-CM | POA: Diagnosis not present

## 2022-09-29 DIAGNOSIS — Z0001 Encounter for general adult medical examination with abnormal findings: Secondary | ICD-10-CM | POA: Diagnosis not present

## 2022-09-29 DIAGNOSIS — E063 Autoimmune thyroiditis: Secondary | ICD-10-CM | POA: Diagnosis not present

## 2022-09-29 DIAGNOSIS — Z125 Encounter for screening for malignant neoplasm of prostate: Secondary | ICD-10-CM | POA: Diagnosis not present

## 2022-09-29 DIAGNOSIS — Z1331 Encounter for screening for depression: Secondary | ICD-10-CM | POA: Diagnosis not present

## 2022-10-14 DIAGNOSIS — M1712 Unilateral primary osteoarthritis, left knee: Secondary | ICD-10-CM | POA: Diagnosis not present

## 2022-10-26 ENCOUNTER — Other Ambulatory Visit (HOSPITAL_COMMUNITY): Payer: Self-pay | Admitting: Orthopedic Surgery

## 2022-10-26 DIAGNOSIS — M25562 Pain in left knee: Secondary | ICD-10-CM

## 2022-11-16 ENCOUNTER — Ambulatory Visit (HOSPITAL_COMMUNITY)
Admission: RE | Admit: 2022-11-16 | Discharge: 2022-11-16 | Disposition: A | Discharge status: Home / Self Care | Payer: PPO | Source: Ambulatory Visit | Attending: Orthopedic Surgery | Admitting: Orthopedic Surgery

## 2022-11-16 DIAGNOSIS — M25562 Pain in left knee: Secondary | ICD-10-CM | POA: Diagnosis not present

## 2022-12-01 DIAGNOSIS — M170 Bilateral primary osteoarthritis of knee: Secondary | ICD-10-CM | POA: Diagnosis not present

## 2023-01-27 DIAGNOSIS — M1712 Unilateral primary osteoarthritis, left knee: Secondary | ICD-10-CM | POA: Diagnosis not present
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
## 2023-05-03 NOTE — Patient Instructions (Signed)
SURGICAL WAITING ROOM VISITATION  Patients having surgery or a procedure may have no more than 2 support people in the waiting area - these visitors may rotate.    Children under the age of 37 must have an adult with them who is not the patient.  Due to an increase in RSV and influenza rates and associated hospitalizations, children ages 8 and under may not visit patients in [facility] hospitals.  If the patient needs to stay at the hospital during part of their recovery, the visitor guidelines for inpatient rooms apply. Pre-op nurse will coordinate an appropriate time for 1 support person to accompany patient in pre-op.  This support person may not rotate.    Please refer to the [facility] website for the visitor guidelines for Inpatients (after your surgery is over and you are in a regular room).       Your procedure is scheduled on:  05/22/2023    Report to [facility] Main Entrance    Report to admitting at  0900 AM   Call this number if you have problems the morning of surgery [phone]   Do not eat food :After Midnight.   After Midnight you may have the following liquids until _0830_____ AM DAY OF SURGERY  Water Non-Citrus Juices (without pulp, NO RED-Apple, White grape, White cranberry) Black Coffee (NO MILK/CREAM OR CREAMERS, sugar ok)  Clear Tea (NO MILK/CREAM OR CREAMERS, sugar ok) regular and decaf                             Plain Jell-O (NO RED)                                           Fruit ices (not with fruit pulp, NO RED)                                     Popsicles (NO RED)                                                               Sports drinks like Gatorade (NO RED)                     The day of surgery:  Drink ONE (1) Pre-Surgery Clear Ensure or G2 at   0830 am ( have completed by ) the morning of surgery.  Drink in one sitting.   This drink was given to you during your hospital  pre-op appointment visit. Nothing else to drink after  completing the  Pre-Surgery Clear Ensure or G2.          If you have questions, please contact your surgeon's office.   FOLLOW BOWEL PREP AND ANY ADDITIONAL PRE OP INSTRUCTIONS YOU RECEIVED FROM YOUR SURGEON'S OFFICE!!!     Oral Hygiene is also important to reduce your risk of infection.  Remember - BRUSH YOUR TEETH THE MORNING OF SURGERY WITH YOUR REGULAR TOOTHPASTE  DENTURES WILL BE REMOVED PRIOR TO SURGERY PLEASE DO NOT APPLY "Poly grip" OR ADHESIVES!!!   Do NOT smoke after Midnight   Stop all vitamins and herbal supplements 7 days before surgery.   Take these medicines the morning of surgery with A SIP OF WATER:   DO NOT TAKE ANY ORAL DIABETIC MEDICATIONS DAY OF YOUR SURGERY  Bring CPAP mask and tubing day of surgery.                              You may not have any metal on your body including hair pins, jewelry, and body piercing             Do not wear make-up, lotions, powders, perfumes/cologne, or deodorant  Do not wear nail polish including gel and S&S, artificial/acrylic nails, or any other type of covering on natural nails including finger and toenails. If you have artificial nails, gel coating, etc. that needs to be removed by a nail salon please have this removed prior to surgery or surgery may need to be canceled/ delayed if the surgeon/ anesthesia feels like they are unable to be safely monitored.   Do not shave  48 hours prior to surgery.               Men may shave face and neck.   Do not bring valuables to the hospital. [facility] IS NOT             RESPONSIBLE   FOR VALUABLES.   Contacts, glasses, dentures or bridgework may not be worn into surgery.   Bring small overnight bag day of surgery.   DO NOT BRING YOUR HOME MEDICATIONS TO THE HOSPITAL. PHARMACY WILL DISPENSE MEDICATIONS LISTED ON YOUR MEDICATION LIST TO YOU DURING YOUR ADMISSION IN THE HOSPITAL!    Patients discharged on the day of surgery will not be  allowed to drive home.  Someone NEEDS to stay with you for the first 24 hours after anesthesia.   Special Instructions: Bring a copy of your healthcare power of attorney and living will documents the day of surgery if you haven't scanned them before.              Please read over the following fact sheets you were given: IF YOU HAVE QUESTIONS ABOUT YOUR PRE-OP INSTRUCTIONS PLEASE CALL [phone]   If you received a COVID test during your pre-op visit  it is requested that you wear a mask when out in public, stay away from anyone that may not be feeling well and notify your surgeon if you develop symptoms. If you test positive for Covid or have been in contact with anyone that has tested positive in the last 10 days please notify you surgeon.      Pre-operative 5 CHG Bath Instructions   You can play a key role in reducing the risk of infection after surgery. Your skin needs to be as free of germs as possible. You can reduce the number of germs on your skin by washing with CHG (chlorhexidine gluconate) soap before surgery. CHG is an antiseptic soap that kills germs and continues to kill germs even after washing.   DO NOT use if you have an allergy to chlorhexidine/CHG or antibacterial soaps. If your skin becomes reddened or irritated, stop using the CHG and notify one of our RNs at [phone].  Please shower with the CHG soap starting 4 days before surgery using the following schedule:     Please keep in mind the following:  DO NOT shave, including legs and underarms, starting the day of your first shower.   You may shave your face at any point before/day of surgery.  Place clean sheets on your bed the day you start using CHG soap. Use a clean washcloth (not used since being washed) for each shower. DO NOT sleep with pets once you start using the CHG.   CHG Shower Instructions:  If you choose to wash your hair and private area, wash first with your normal shampoo/soap.  After you use  shampoo/soap, rinse your hair and body thoroughly to remove shampoo/soap residue.  Turn the water OFF and apply about 3 tablespoons (45 ml) of CHG soap to a CLEAN washcloth.  Apply CHG soap ONLY FROM YOUR NECK DOWN TO YOUR TOES (washing for 3-5 minutes)  DO NOT use CHG soap on face, private areas, open wounds, or sores.  Pay special attention to the area where your surgery is being performed.  If you are having back surgery, having someone wash your back for you may be helpful. Wait 2 minutes after CHG soap is applied, then you may rinse off the CHG soap.  Pat dry with a clean towel  Put on clean clothes/pajamas   If you choose to wear lotion, please use ONLY the CHG-compatible lotions on the back of this paper.     Additional instructions for the day of surgery: DO NOT APPLY any lotions, deodorants, cologne, or perfumes.   Put on clean/comfortable clothes.  Brush your teeth.  Ask your nurse before applying any prescription medications to the skin.      CHG Compatible Lotions   Aveeno Moisturizing lotion  Cetaphil Moisturizing Cream  Cetaphil Moisturizing Lotion  Clairol Herbal Essence Moisturizing Lotion, Dry Skin  Clairol Herbal Essence Moisturizing Lotion, Extra Dry Skin  Clairol Herbal Essence Moisturizing Lotion, Normal Skin  Curel Age Defying Therapeutic Moisturizing Lotion with Alpha Hydroxy  Curel Extreme Care Body Lotion  Curel Soothing Hands Moisturizing Hand Lotion  Curel Therapeutic Moisturizing Cream, Fragrance-Free  Curel Therapeutic Moisturizing Lotion, Fragrance-Free  Curel Therapeutic Moisturizing Lotion, Original Formula  Eucerin Daily Replenishing Lotion  Eucerin Dry Skin Therapy Plus Alpha Hydroxy Crme  Eucerin Dry Skin Therapy Plus Alpha Hydroxy Lotion  Eucerin Original Crme  Eucerin Original Lotion  Eucerin Plus Crme Eucerin Plus Lotion  Eucerin TriLipid Replenishing Lotion  Keri Anti-Bacterial Hand Lotion  Keri Deep Conditioning Original Lotion Dry  Skin Formula Softly Scented  Keri Deep Conditioning Original Lotion, Fragrance Free Sensitive Skin Formula  Keri Lotion Fast Absorbing Fragrance Free Sensitive Skin Formula  Keri Lotion Fast Absorbing Softly Scented Dry Skin Formula  Keri Original Lotion  Keri Skin Renewal Lotion Keri Silky Smooth Lotion  Keri Silky Smooth Sensitive Skin Lotion  Nivea Body Creamy Conditioning Oil  Nivea Body Extra Enriched Teacher, adult education Moisturizing Lotion Nivea Crme  Nivea Skin Firming Lotion  NutraDerm 30 Skin Lotion  NutraDerm Skin Lotion  NutraDerm Therapeutic Skin Cream  NutraDerm Therapeutic Skin Lotion  ProShield Protective Hand Cream  Provon moisturizing lotion

## 2023-05-04 DIAGNOSIS — E663 Overweight: Secondary | ICD-10-CM | POA: Diagnosis not present

## 2023-05-04 DIAGNOSIS — Z6826 Body mass index (BMI) 26.0-26.9, adult: Secondary | ICD-10-CM | POA: Diagnosis not present

## 2023-05-04 DIAGNOSIS — M109 Gout, unspecified: Secondary | ICD-10-CM | POA: Diagnosis not present

## 2023-05-04 DIAGNOSIS — M1991 Primary osteoarthritis, unspecified site: Secondary | ICD-10-CM | POA: Diagnosis not present

## 2023-05-04 DIAGNOSIS — L409 Psoriasis, unspecified: Secondary | ICD-10-CM | POA: Diagnosis not present

## 2023-05-04 DIAGNOSIS — L4050 Arthropathic psoriasis, unspecified: Secondary | ICD-10-CM | POA: Diagnosis not present

## 2023-05-04 DIAGNOSIS — J329 Chronic sinusitis, unspecified: Secondary | ICD-10-CM | POA: Diagnosis not present

## 2023-05-09 ENCOUNTER — Encounter (HOSPITAL_COMMUNITY)
Admission: RE | Admit: 2023-05-09 | Discharge: 2023-05-09 | Disposition: A | Discharge status: Home / Self Care | Payer: PPO | Source: Ambulatory Visit | Attending: Orthopedic Surgery | Admitting: Orthopedic Surgery

## 2023-05-09 ENCOUNTER — Other Ambulatory Visit: Payer: Self-pay

## 2023-05-09 ENCOUNTER — Encounter (HOSPITAL_COMMUNITY): Payer: Self-pay

## 2023-05-09 VITALS — BP 135/76 | HR 61 | Temp 98.7°F | Resp 16 | Ht 70.0 in | Wt 168.0 lb

## 2023-05-09 DIAGNOSIS — Z01818 Encounter for other preprocedural examination: Secondary | ICD-10-CM

## 2023-05-09 DIAGNOSIS — M1389 Other specified arthritis, multiple sites: Secondary | ICD-10-CM | POA: Diagnosis not present

## 2023-05-09 DIAGNOSIS — Z8674 Personal history of sudden cardiac arrest: Secondary | ICD-10-CM | POA: Diagnosis not present

## 2023-05-09 DIAGNOSIS — Z96651 Presence of right artificial knee joint: Secondary | ICD-10-CM | POA: Insufficient documentation

## 2023-05-09 DIAGNOSIS — Z01812 Encounter for preprocedural laboratory examination: Secondary | ICD-10-CM | POA: Diagnosis not present

## 2023-05-09 DIAGNOSIS — K219 Gastro-esophageal reflux disease without esophagitis: Secondary | ICD-10-CM | POA: Diagnosis not present

## 2023-05-09 DIAGNOSIS — E063 Autoimmune thyroiditis: Secondary | ICD-10-CM | POA: Insufficient documentation

## 2023-05-09 HISTORY — DX: Personal history of (healed) traumatic fracture: Z87.81

## 2023-05-09 HISTORY — DX: Pneumonia, unspecified organism: J18.9

## 2023-05-09 HISTORY — DX: Other specified postprocedural states: Z98.890

## 2023-05-09 HISTORY — DX: Other complications of anesthesia, initial encounter: T88.59XA

## 2023-05-09 HISTORY — DX: Nausea with vomiting, unspecified: R11.2

## 2023-05-09 LAB — CBC
HCT: 49.7 % (ref 39.0–52.0)
Hemoglobin: 16.4 g/dL (ref 13.0–17.0)
MCH: 30.2 pg (ref 26.0–34.0)
MCHC: 33 g/dL (ref 30.0–36.0)
MCV: 91.5 fL (ref 80.0–100.0)
Platelets: 283 10*3/uL (ref 150–400)
RBC: 5.43 MIL/uL (ref 4.22–5.81)
RDW: 13.6 % (ref 11.5–15.5)
WBC: 11.9 10*3/uL — ABNORMAL HIGH (ref 4.0–10.5)
nRBC: 0 % (ref 0.0–0.2)

## 2023-05-09 LAB — SURGICAL PCR SCREEN
MRSA, PCR: NEGATIVE
Staphylococcus aureus: NEGATIVE

## 2023-05-11 ENCOUNTER — Encounter (HOSPITAL_COMMUNITY): Payer: Self-pay
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
## 2023-05-11 NOTE — Anesthesia Preprocedure Evaluation (Addendum)
Anesthesia Evaluation  Patient identified by MRN, date of birth, ID band Patient awake    Reviewed: Allergy & Precautions, H&P , NPO status , Patient's Chart, lab work & pertinent test results  History of Anesthesia Complications (+) PONV and history of anesthetic complications  Airway Mallampati: II  TM Distance: >3 FB Neck ROM: Full    Dental no notable dental hx. (+) Teeth Intact, Dental Advisory Given   Pulmonary neg pulmonary ROS   Pulmonary exam normal breath sounds clear to auscultation       Cardiovascular negative cardio ROS  Rhythm:Regular Rate:Normal     Neuro/Psych negative neurological ROS  negative psych ROS   GI/Hepatic Neg liver ROS, hiatal hernia,GERD  Medicated,,  Endo/Other  negative endocrine ROS    Renal/GU negative Renal ROS  negative genitourinary   Musculoskeletal  (+) Arthritis , Osteoarthritis,    Abdominal   Peds  Hematology negative hematology ROS (+)   Anesthesia Other Findings   Reproductive/Obstetrics negative OB ROS                             Anesthesia Physical Anesthesia Plan  ASA: 2  Anesthesia Plan: Spinal   Post-op Pain Management: Regional block* and Ofirmev IV (intra-op)*   Induction: Intravenous  PONV Risk Score and Plan: 3 and Ondansetron and Dexamethasone  Airway Management Planned: Natural Airway and Simple Face Mask  Additional Equipment:   Intra-op Plan:   Post-operative Plan:   Informed Consent: I have reviewed the patients History and Physical, chart, labs and discussed the procedure including the risks, benefits and alternatives for the proposed anesthesia with the patient or authorized representative who has indicated his/her understanding and acceptance.     Dental advisory given  Plan Discussed with: CRNA  Anesthesia Plan Comments: (See PAT note from 10/22 by K Gekas PA-C. Allergy to Fentanyl. Copy of medical  clearance in chart )        Anesthesia Quick Evaluation

## 2023-05-22 ENCOUNTER — Encounter (HOSPITAL_COMMUNITY): Payer: Self-pay | Admitting: Orthopedic Surgery

## 2023-05-22 ENCOUNTER — Ambulatory Visit (HOSPITAL_COMMUNITY): Payer: PPO | Admitting: Anesthesiology

## 2023-05-22 ENCOUNTER — Observation Stay (HOSPITAL_COMMUNITY)
Admission: RE | Admit: 2023-05-22 | Discharge: 2023-05-23 | Disposition: A | Discharge status: Home / Self Care | Payer: PPO | Source: Ambulatory Visit | Attending: Orthopedic Surgery | Admitting: Orthopedic Surgery

## 2023-05-22 ENCOUNTER — Ambulatory Visit (HOSPITAL_COMMUNITY): Payer: PPO | Admitting: Medical

## 2023-05-22 ENCOUNTER — Encounter (HOSPITAL_COMMUNITY)
Admission: RE | Disposition: A | Discharge status: Home / Self Care | Payer: Self-pay | Source: Ambulatory Visit | Attending: Orthopedic Surgery

## 2023-05-22 ENCOUNTER — Other Ambulatory Visit: Payer: Self-pay

## 2023-05-22 DIAGNOSIS — M179 Osteoarthritis of knee, unspecified: Principal | ICD-10-CM | POA: Diagnosis present

## 2023-05-22 DIAGNOSIS — M1712 Unilateral primary osteoarthritis, left knee: Principal | ICD-10-CM | POA: Insufficient documentation

## 2023-05-22 DIAGNOSIS — Z96651 Presence of right artificial knee joint: Secondary | ICD-10-CM | POA: Insufficient documentation

## 2023-05-22 DIAGNOSIS — G8918 Other acute postprocedural pain: Secondary | ICD-10-CM | POA: Diagnosis not present

## 2023-05-22 DIAGNOSIS — Z01818 Encounter for other preprocedural examination: Secondary | ICD-10-CM

## 2023-05-22 HISTORY — PX: TOTAL KNEE ARTHROPLASTY: SHX125

## 2023-05-22 SURGERY — ARTHROPLASTY, KNEE, TOTAL
Anesthesia: Spinal | Site: Knee | Laterality: Left

## 2023-05-22 MED ORDER — DOCUSATE SODIUM 100 MG PO CAPS
100.0000 mg | ORAL_CAPSULE | Freq: Two times a day (BID) | ORAL | Status: DC
Start: 2023-05-22 — End: 2023-05-23
  Administered 2023-05-22 – 2023-05-23 (×2): 100 mg via ORAL
  Filled 2023-05-22 (×2): qty 1

## 2023-05-22 MED ORDER — PANTOPRAZOLE SODIUM 40 MG PO TBEC: 40.0000 mg | DELAYED_RELEASE_TABLET | Freq: Every day | ORAL | Status: DC | PRN

## 2023-05-22 MED ORDER — LACTATED RINGERS IV SOLN: INTRAVENOUS | Status: DC | PRN

## 2023-05-22 MED ORDER — ONDANSETRON HCL 4 MG/2ML IJ SOLN
INTRAMUSCULAR | Status: DC | PRN
  Administered 2023-05-22: 4 mg via INTRAVENOUS

## 2023-05-22 MED ORDER — CEFAZOLIN SODIUM-DEXTROSE 2-4 GM/100ML-% IV SOLN
2.0000 g | Freq: Four times a day (QID) | INTRAVENOUS | Status: AC
  Administered 2023-05-22 (×2): 2 g via INTRAVENOUS
  Filled 2023-05-22 (×2): qty 100

## 2023-05-22 MED ORDER — DIPHENHYDRAMINE HCL 12.5 MG/5ML PO ELIX: 12.5000 mg | ORAL_SOLUTION | ORAL | Status: DC | PRN

## 2023-05-22 MED ORDER — LACTATED RINGERS IV SOLN: INTRAVENOUS | Status: DC

## 2023-05-22 MED ORDER — ACETAMINOPHEN 10 MG/ML IV SOLN
1000.0000 mg | Freq: Four times a day (QID) | INTRAVENOUS | Status: DC
  Administered 2023-05-22: 1000 mg via INTRAVENOUS
  Filled 2023-05-22: qty 100

## 2023-05-22 MED ORDER — STERILE WATER FOR IRRIGATION IR SOLN
Status: DC | PRN
  Administered 2023-05-22: 1000 mL

## 2023-05-22 MED ORDER — BUPIVACAINE LIPOSOME 1.3 % IJ SUSP
INTRAMUSCULAR | Status: AC
  Filled 2023-05-22: qty 20

## 2023-05-22 MED ORDER — SODIUM CHLORIDE (PF) 0.9 % IJ SOLN
INTRAMUSCULAR | Status: AC
  Filled 2023-05-22: qty 50

## 2023-05-22 MED ORDER — ORAL CARE MOUTH RINSE: 15.0000 mL | OROMUCOSAL | Status: DC | PRN

## 2023-05-22 MED ORDER — MIDAZOLAM HCL 2 MG/2ML IJ SOLN
1.0000 mg | INTRAMUSCULAR | Status: DC
  Administered 2023-05-22: 1 mg via INTRAVENOUS
  Filled 2023-05-22: qty 2

## 2023-05-22 MED ORDER — BUPIVACAINE LIPOSOME 1.3 % IJ SUSP
INTRAMUSCULAR | Status: DC | PRN
  Administered 2023-05-22: 20 mL

## 2023-05-22 MED ORDER — HYDROMORPHONE HCL 2 MG PO TABS
1.0000 mg | ORAL_TABLET | ORAL | Status: DC | PRN
  Administered 2023-05-22 – 2023-05-23 (×3): 2 mg via ORAL
  Filled 2023-05-22 (×4): qty 1

## 2023-05-22 MED ORDER — FLEET ENEMA RE ENEM
1.0000 | ENEMA | Freq: Once | RECTAL | Status: DC | PRN
Start: 2023-05-22 — End: 2023-05-23

## 2023-05-22 MED ORDER — ORAL CARE MOUTH RINSE: 15.0000 mL | Freq: Once | OROMUCOSAL | Status: AC

## 2023-05-22 MED ORDER — METHOCARBAMOL 1000 MG/10ML IJ SOLN: 500.0000 mg | Freq: Four times a day (QID) | INTRAMUSCULAR | Status: DC | PRN

## 2023-05-22 MED ORDER — ONDANSETRON HCL 4 MG/2ML IJ SOLN: 4.0000 mg | Freq: Four times a day (QID) | INTRAMUSCULAR | Status: DC | PRN

## 2023-05-22 MED ORDER — ACETAMINOPHEN 500 MG PO TABS
1000.0000 mg | ORAL_TABLET | Freq: Four times a day (QID) | ORAL | Status: DC
  Administered 2023-05-22 – 2023-05-23 (×3): 1000 mg via ORAL
  Filled 2023-05-22 (×3): qty 2

## 2023-05-22 MED ORDER — SODIUM CHLORIDE 0.9 % IR SOLN
Status: DC | PRN
  Administered 2023-05-22: 1000 mL

## 2023-05-22 MED ORDER — BUPIVACAINE-EPINEPHRINE (PF) 0.5% -1:200000 IJ SOLN
INTRAMUSCULAR | Status: DC | PRN
  Administered 2023-05-22: 20 mL via PERINEURAL

## 2023-05-22 MED ORDER — SODIUM CHLORIDE (PF) 0.9 % IJ SOLN
INTRAMUSCULAR | Status: AC
  Filled 2023-05-22: qty 10

## 2023-05-22 MED ORDER — PROPOFOL 10 MG/ML IV BOLUS
INTRAVENOUS | Status: AC
  Filled 2023-05-22: qty 20

## 2023-05-22 MED ORDER — 0.9 % SODIUM CHLORIDE (POUR BTL) OPTIME
TOPICAL | Status: DC | PRN
  Administered 2023-05-22: 1000 mL

## 2023-05-22 MED ORDER — MIDAZOLAM HCL 2 MG/2ML IJ SOLN
INTRAMUSCULAR | Status: AC
  Filled 2023-05-22: qty 2

## 2023-05-22 MED ORDER — MENTHOL 3 MG MT LOZG: 1.0000 | LOZENGE | OROMUCOSAL | Status: DC | PRN

## 2023-05-22 MED ORDER — ASPIRIN 81 MG PO CHEW
81.0000 mg | CHEWABLE_TABLET | Freq: Two times a day (BID) | ORAL | Status: DC
  Administered 2023-05-23: 81 mg via ORAL
  Filled 2023-05-22: qty 1

## 2023-05-22 MED ORDER — CHLORHEXIDINE GLUCONATE 0.12 % MT SOLN
15.0000 mL | Freq: Once | OROMUCOSAL | Status: AC
  Administered 2023-05-22: 15 mL via OROMUCOSAL

## 2023-05-22 MED ORDER — BUPIVACAINE IN DEXTROSE 0.75-8.25 % IT SOLN
INTRATHECAL | Status: DC | PRN
  Administered 2023-05-22: 1.6 mL via INTRATHECAL

## 2023-05-22 MED ORDER — DEXAMETHASONE SODIUM PHOSPHATE 10 MG/ML IJ SOLN
8.0000 mg | Freq: Once | INTRAMUSCULAR | Status: AC
  Administered 2023-05-22: 8 mg via INTRAVENOUS

## 2023-05-22 MED ORDER — HYDROMORPHONE HCL 1 MG/ML IJ SOLN: 0.5000 mg | INTRAMUSCULAR | Status: DC | PRN

## 2023-05-22 MED ORDER — BUPIVACAINE LIPOSOME 1.3 % IJ SUSP: 20.0000 mL | Freq: Once | INTRAMUSCULAR | Status: DC

## 2023-05-22 MED ORDER — BISACODYL 10 MG RE SUPP: 10.0000 mg | Freq: Every day | RECTAL | Status: DC | PRN

## 2023-05-22 MED ORDER — PROPOFOL 500 MG/50ML IV EMUL
INTRAVENOUS | Status: DC | PRN
  Administered 2023-05-22: 10 mg via INTRAVENOUS
  Administered 2023-05-22: 100 ug/kg/min via INTRAVENOUS

## 2023-05-22 MED ORDER — METOCLOPRAMIDE HCL 5 MG/ML IJ SOLN
5.0000 mg | Freq: Three times a day (TID) | INTRAMUSCULAR | Status: DC | PRN
Start: 2023-05-22 — End: 2023-05-23

## 2023-05-22 MED ORDER — METHOCARBAMOL 500 MG PO TABS
500.0000 mg | ORAL_TABLET | Freq: Four times a day (QID) | ORAL | Status: DC | PRN
  Administered 2023-05-22 – 2023-05-23 (×2): 500 mg via ORAL
  Filled 2023-05-22 (×2): qty 1

## 2023-05-22 MED ORDER — MIDAZOLAM HCL 5 MG/5ML IJ SOLN
INTRAMUSCULAR | Status: DC | PRN
  Administered 2023-05-22 (×2): 1 mg via INTRAVENOUS

## 2023-05-22 MED ORDER — CEFAZOLIN SODIUM-DEXTROSE 2-4 GM/100ML-% IV SOLN
2.0000 g | INTRAVENOUS | Status: AC
  Administered 2023-05-22: 2 g via INTRAVENOUS
  Filled 2023-05-22: qty 100

## 2023-05-22 MED ORDER — POVIDONE-IODINE 10 % EX SWAB: 2.0000 | Freq: Once | CUTANEOUS | Status: DC

## 2023-05-22 MED ORDER — METOCLOPRAMIDE HCL 5 MG PO TABS: 5.0000 mg | ORAL_TABLET | Freq: Three times a day (TID) | ORAL | Status: DC | PRN

## 2023-05-22 MED ORDER — PHENOL 1.4 % MT LIQD: 1.0000 | OROMUCOSAL | Status: DC | PRN

## 2023-05-22 MED ORDER — ONDANSETRON HCL 4 MG PO TABS: 4.0000 mg | ORAL_TABLET | Freq: Four times a day (QID) | ORAL | Status: DC | PRN

## 2023-05-22 MED ORDER — SODIUM CHLORIDE (PF) 0.9 % IJ SOLN
INTRAMUSCULAR | Status: DC | PRN
  Administered 2023-05-22: 60 mL via INTRAVENOUS

## 2023-05-22 MED ORDER — DEXAMETHASONE SODIUM PHOSPHATE 10 MG/ML IJ SOLN
10.0000 mg | Freq: Once | INTRAMUSCULAR | Status: AC
  Administered 2023-05-23: 10 mg via INTRAVENOUS
  Filled 2023-05-22: qty 1

## 2023-05-22 MED ORDER — FENTANYL CITRATE (PF) 100 MCG/2ML IJ SOLN
INTRAMUSCULAR | Status: AC
  Filled 2023-05-22: qty 2

## 2023-05-22 MED ORDER — SODIUM CHLORIDE 0.9 % IV SOLN: INTRAVENOUS | Status: DC

## 2023-05-22 MED ORDER — TRAMADOL HCL 50 MG PO TABS
50.0000 mg | ORAL_TABLET | Freq: Four times a day (QID) | ORAL | Status: DC | PRN
Start: 2023-05-22 — End: 2023-05-23
  Administered 2023-05-22: 100 mg via ORAL
  Filled 2023-05-22 (×2): qty 2

## 2023-05-22 MED ORDER — POLYETHYLENE GLYCOL 3350 17 G PO PACK: 17.0000 g | PACK | Freq: Every day | ORAL | Status: DC | PRN

## 2023-05-22 MED ORDER — TRANEXAMIC ACID-NACL 1000-0.7 MG/100ML-% IV SOLN
1000.0000 mg | INTRAVENOUS | Status: AC
  Administered 2023-05-22: 1000 mg via INTRAVENOUS
  Filled 2023-05-22: qty 100

## 2023-05-22 SURGICAL SUPPLY — 55 items
ADH SKN CLS APL DERMABOND .7 (GAUZE/BANDAGES/DRESSINGS) ×1
ATTUNE MED DOME PAT 38 KNEE (Knees) IMPLANT
ATTUNE PS FEM LT SZ 6 CEM KNEE (Femur) IMPLANT
ATTUNE PSRP INSR SZ6 10 KNEE (Insert) IMPLANT
BAG COUNTER SPONGE SURGICOUNT (BAG) IMPLANT
BAG SPEC THK2 15X12 ZIP CLS (MISCELLANEOUS) ×1
BAG SPNG CNTER NS LX DISP (BAG)
BAG ZIPLOCK 12X15 (MISCELLANEOUS) ×1 IMPLANT
BASE TIBIA ATTUNE KNEE SYS SZ6 (Knees) IMPLANT
BLADE SAG 18X100X1.27 (BLADE) ×1 IMPLANT
BLADE SAW SGTL 11.0X1.19X90.0M (BLADE) ×1 IMPLANT
BNDG CMPR 6 X 5 YARDS HK CLSR (GAUZE/BANDAGES/DRESSINGS) ×1
BNDG CMPR MED 10X6 ELC LF (GAUZE/BANDAGES/DRESSINGS) ×1
BNDG ELASTIC 6INX 5YD STR LF (GAUZE/BANDAGES/DRESSINGS) ×1 IMPLANT
BNDG ELASTIC 6X10 VLCR STRL LF (GAUZE/BANDAGES/DRESSINGS) IMPLANT
BOWL SMART MIX CTS (DISPOSABLE) ×1 IMPLANT
BSPLAT TIB 6 CMNT ROT PLAT STR (Knees) ×1 IMPLANT
CEMENT HV SMART SET (Cement) ×2 IMPLANT
COVER SURGICAL LIGHT HANDLE (MISCELLANEOUS) ×1 IMPLANT
CUFF TOURN SGL QUICK 34 (TOURNIQUET CUFF) ×1
CUFF TRNQT CYL 34X4.125X (TOURNIQUET CUFF) ×1 IMPLANT
DERMABOND ADVANCED .7 DNX12 (GAUZE/BANDAGES/DRESSINGS) ×1 IMPLANT
DRAPE U-SHAPE 47X51 STRL (DRAPES) ×1 IMPLANT
DRSG AQUACEL AG ADV 3.5X10 (GAUZE/BANDAGES/DRESSINGS) ×1 IMPLANT
DURAPREP 26ML APPLICATOR (WOUND CARE) ×1 IMPLANT
ELECT REM PT RETURN 15FT ADLT (MISCELLANEOUS) ×1 IMPLANT
GLOVE BIO SURGEON STRL SZ 6.5 (GLOVE) IMPLANT
GLOVE BIO SURGEON STRL SZ8 (GLOVE) ×1 IMPLANT
GLOVE BIOGEL PI IND STRL 6.5 (GLOVE) IMPLANT
GLOVE BIOGEL PI IND STRL 7.0 (GLOVE) IMPLANT
GLOVE BIOGEL PI IND STRL 8 (GLOVE) ×1 IMPLANT
GOWN STRL REUS W/ TWL LRG LVL3 (GOWN DISPOSABLE) ×1 IMPLANT
GOWN STRL REUS W/TWL LRG LVL3 (GOWN DISPOSABLE) ×1
HANDPIECE INTERPULSE COAX TIP (DISPOSABLE) ×1
HOLDER FOLEY CATH W/STRAP (MISCELLANEOUS) IMPLANT
IMMOBILIZER KNEE 20 (SOFTGOODS) ×1
IMMOBILIZER KNEE 20 THIGH 36 (SOFTGOODS) ×1 IMPLANT
KIT TURNOVER KIT A (KITS) IMPLANT
MANIFOLD NEPTUNE II (INSTRUMENTS) ×1 IMPLANT
NS IRRIG 1000ML POUR BTL (IV SOLUTION) ×1 IMPLANT
PACK TOTAL KNEE CUSTOM (KITS) ×1 IMPLANT
PADDING CAST COTTON 6X4 STRL (CAST SUPPLIES) ×2 IMPLANT
PIN STEINMAN FIXATION KNEE (PIN) IMPLANT
PROTECTOR NERVE ULNAR (MISCELLANEOUS) ×1 IMPLANT
SET HNDPC FAN SPRY TIP SCT (DISPOSABLE) ×1 IMPLANT
SUT MNCRL AB 4-0 PS2 18 (SUTURE) ×1 IMPLANT
SUT STRATAFIX 0 PDS 27 VIOLET (SUTURE) ×1
SUT VIC AB 2-0 CT1 27 (SUTURE) ×3
SUT VIC AB 2-0 CT1 TAPERPNT 27 (SUTURE) ×3 IMPLANT
SUTURE STRATFX 0 PDS 27 VIOLET (SUTURE) ×1 IMPLANT
TIBIA ATTUNE KNEE SYS BASE SZ6 (Knees) ×1 IMPLANT
TRAY FOLEY MTR SLVR 16FR STAT (SET/KITS/TRAYS/PACK) IMPLANT
TUBE SUCTION HIGH CAP CLEAR NV (SUCTIONS) ×1 IMPLANT
WATER STERILE IRR 1000ML POUR (IV SOLUTION) ×2 IMPLANT
WRAP KNEE MAXI GEL POST OP (GAUZE/BANDAGES/DRESSINGS) ×1 IMPLANT

## 2023-05-22 NOTE — Plan of Care (Signed)
CHL Tonsillectomy/Adenoidectomy, Postoperative PEDS care plan entered in error.

## 2023-05-22 NOTE — Progress Notes (Signed)
Orthopedic Tech Progress Note Patient Details:  Antonio Daniel 06/12/1951 [id number]  CPM Left Knee CPM Left Knee: On Left Knee Flexion (Degrees): 40 Left Knee Extension (Degrees): 10  Post Interventions Patient Tolerated: Well Instructions Provided: Care of device  [name] 05/22/2023, 1:24 PM

## 2023-05-22 NOTE — Interval H&P Note (Signed)
History and Physical Interval Note:  05/22/2023 9:30 AM  Antonio Daniel  has presented today for surgery, with the diagnosis of left knee osteoarthritis.  The various methods of treatment have been discussed with the patient and family. After consideration of risks, benefits and other options for treatment, the patient has consented to  Procedure(s): TOTAL KNEE ARTHROPLASTY (Left) as a surgical intervention.  The patient's history has been reviewed, patient examined, no change in status, stable for surgery.  I have reviewed the patient's chart and labs.  Questions were answered to the patient's satisfaction.     [name]

## 2023-05-22 NOTE — Op Note (Signed)
OPERATIVE REPORT-TOTAL KNEE ARTHROPLASTY   Pre-operative diagnosis- Osteoarthritis  Left knee(s)  Post-operative diagnosis- Osteoarthritis Left knee(s)  Procedure-  Left  Total Knee Arthroplasty  Surgeon- Gus Rankin. Lovis More, MD  Assistant- Weston Brass, PA-C   Anesthesia-   Adductor canal block and spinal  EBL- 25 ml   Drains None  Tourniquet time-  Total Tourniquet Time Documented: Thigh (Left) - 32 minutes Total: Thigh (Left) - 32 minutes     Complications- None  Condition-PACU - hemodynamically stable.   Brief Clinical Note   Antonio Daniel is a 72 y.o. year old male with end stage OA of his left knee with progressively worsening pain and dysfunction. He has constant pain, with activity and at rest and significant functional deficits with difficulties even with ADLs. He has had extensive non-op management including analgesics, injections of cortisone and viscosupplements, and home exercise program, but remains in significant pain with significant dysfunction. Radiographs show bone on bone arthritis medial and patellofemoral. He presents now for left Total Knee Arthroplasty.     Procedure in detail---   The patient is brought into the operating room and positioned supine on the operating table. After successful administration of  Adductor canal block and spinal,   a tourniquet is placed high on the  Left thigh(s) and the lower extremity is prepped and draped in the usual sterile fashion. Time out is performed by the operating team and then the  Left lower extremity is wrapped in Esmarch, knee flexed and the tourniquet inflated to 300 mmHg.       A midline incision is made with a ten blade through the subcutaneous tissue to the level of the extensor mechanism. A fresh blade is used to make a medial parapatellar arthrotomy. Soft tissue over the proximal medial tibia is subperiosteally elevated to the joint line with a knife and into the semimembranosus bursa with a Cobb  elevator. Soft tissue over the proximal lateral tibia is elevated with attention being paid to avoiding the patellar tendon on the tibial tubercle. The patella is everted, knee flexed 90 degrees and the ACL and PCL are removed. Findings are bone on bone medial and patellofemoral with large global osteophytes        The drill is used to create a starting hole in the distal femur and the canal is thoroughly irrigated with sterile saline to remove the fatty contents. The 5 degree Left  valgus alignment guide is placed into the femoral canal and the distal femoral cutting block is pinned to remove 9 mm off the distal femur. Resection is made with an oscillating saw.      The tibia is subluxed forward and the menisci are removed. The extramedullary alignment guide is placed referencing proximally at the medial aspect of the tibial tubercle and distally along the second metatarsal axis and tibial crest. The block is pinned to remove 2mm off the more deficient medial  side. Resection is made with an oscillating saw. Size 6is the most appropriate size for the tibia and the proximal tibia is prepared with the modular drill and keel punch for that size.      The femoral sizing guide is placed and size 6 is most appropriate. Rotation is marked off the epicondylar axis and confirmed by creating a rectangular flexion gap at 90 degrees. The size 6 cutting block is pinned in this rotation and the anterior, posterior and chamfer cuts are made with the oscillating saw. The intercondylar block is then placed and that  cut is made.      Trial size 6 tibial component, trial size 6 posterior stabilized femur and a 10  mm posterior stabilized rotating platform insert trial is placed. Full extension is achieved with excellent varus/valgus and anterior/posterior balance throughout full range of motion. The patella is everted and thickness measured to be 24  mm. Free hand resection is taken to 14 mm, a 38 template is placed, lug holes  are drilled, trial patella is placed, and it tracks normally. Osteophytes are removed off the posterior femur with the trial in place. All trials are removed and the cut bone surfaces prepared with pulsatile lavage. Cement is mixed and once ready for implantation, the size 6 tibial implant, size  6 posterior stabilized femoral component, and the size 38 patella are cemented in place and the patella is held with the clamp. The trial insert is placed and the knee held in full extension. The Exparel (20 ml mixed with 60 ml saline) is injected into the extensor mechanism, posterior capsule, medial and lateral gutters and subcutaneous tissues.  All extruded cement is removed and once the cement is hard the permanent 10 mm posterior stabilized rotating platform insert is placed into the tibial tray.      The wound is copiously irrigated with saline solution and the extensor mechanism closed with # 0 Stratofix suture. The tourniquet is released for a total tourniquet time of 32  minutes. Flexion against gravity is 140 degrees and the patella tracks normally. Subcutaneous tissue is closed with 2.0 vicryl and subcuticular with running 4.0 Monocryl. The incision is cleaned and dried and steri-strips and a bulky sterile dressing are applied. The limb is placed into a knee immobilizer and the patient is awakened and transported to recovery in stable condition.      Please note that a surgical assistant was a medical necessity for this procedure in order to perform it in a safe and expeditious manner. Surgical assistant was necessary to retract the ligaments and vital neurovascular structures to prevent injury to them and also necessary for proper positioning of the limb to allow for anatomic placement of the prosthesis.   Gus Rankin Carola Viramontes, MD    05/22/2023, 12:44 PM

## 2023-05-22 NOTE — Discharge Instructions (Addendum)
 Frank Aluisio, MD Total Joint Specialist [facility] [address] [place], [place] [address] [phone]  TOTAL KNEE REPLACEMENT POSTOPERATIVE DIRECTIONS    Knee Rehabilitation, Guidelines Following Surgery  Results after knee surgery are often greatly improved when you follow the exercise, range of motion and muscle strengthening exercises prescribed by your doctor. Safety measures are also important to protect the knee from further injury. If any of these exercises cause you to have increased pain or swelling in your knee joint, decrease the amount until you are comfortable again and slowly increase them. If you have problems or questions, call your caregiver or physical therapist for advice.   BLOOD CLOT PREVENTION Take 81 mg Aspirin two times a day for three weeks following surgery. Then take an 81 mg Aspirin once a day for three weeks. Then discontinue Aspirin. You may resume your vitamins/supplements upon discharge from the hospital. Do not take any NSAIDs (Advil, Aleve, Ibuprofen, Meloxicam, etc.) for 3 weeks, while taking 81mg Aspirin twice a day.   HOME CARE INSTRUCTIONS  Remove items at home which could result in a fall. This includes throw rugs or furniture in walking pathways.  ICE to the affected knee as much as tolerated. Icing helps control swelling. If the swelling is well controlled you will be more comfortable and rehab easier. Continue to use ice on the knee for pain and swelling from surgery. You may notice swelling that will progress down to the foot and ankle. This is normal after surgery. Elevate the leg when you are not up walking on it.    Continue to use the breathing machine which will help keep your temperature down. It is common for your temperature to cycle up and down following surgery, especially at night when you are not up moving around and exerting yourself. The breathing machine keeps your lungs expanded and your temperature  down. Do not place pillow under the operative knee, focus on keeping the knee straight while resting  DIET You may resume your previous home diet once you are discharged from the hospital.  DRESSING / WOUND CARE / SHOWERING Keep your bulky bandage on for 2 days. On the third post-operative day you may remove the Ace bandage and gauze. There is a waterproof adhesive bandage on your skin which will stay in place until your first follow-up appointment. Once you remove this you will not need to place another bandage You may begin showering 3 days following surgery, but do not submerge the incision under water.  ACTIVITY For the first 5 days, the key is rest and control of pain and swelling Do your home exercises twice a day starting on post-operative day 3. On the days you go to physical therapy, just do the home exercises once that day. You should rest, ice and elevate the leg for 50 minutes out of every hour. Get up and walk/stretch for 10 minutes per hour. After 5 days you can increase your activity slowly as tolerated. Walk with your walker as instructed. Use the walker until you are comfortable transitioning to a cane. Walk with the cane in the opposite hand of the operative leg. You may discontinue the cane once you are comfortable and walking steadily. Avoid periods of inactivity such as sitting longer than an hour when not asleep. This helps prevent blood clots.  You may discontinue the knee immobilizer once you are able to perform a straight leg raise while lying down. You may resume a sexual relationship in   one month or when given the OK by your doctor.  You may return to work once you are cleared by your doctor.  Do not drive a car for 6 weeks or until released by your surgeon.  Do not drive while taking narcotics.  TED HOSE STOCKINGS Wear the elastic stockings on both legs for three weeks following surgery during the day. You may remove them at night for sleeping.  WEIGHT  BEARING Weight bearing as tolerated with assist device (walker, cane, etc) as directed, use it as long as suggested by your surgeon or therapist, typically at least 4-6 weeks.  POSTOPERATIVE CONSTIPATION PROTOCOL Constipation - defined medically as fewer than three stools per week and severe constipation as less than one stool per week.  One of the most common issues patients have following surgery is constipation.  Even if you have a regular bowel pattern at home, your normal regimen is likely to be disrupted due to multiple reasons following surgery.  Combination of anesthesia, postoperative narcotics, change in appetite and fluid intake all can affect your bowels.  In order to avoid complications following surgery, here are some recommendations in order to help you during your recovery period.  Colace (docusate) - Pick up an over-the-counter form of Colace or another stool softener and take twice a day as long as you are requiring postoperative pain medications.  Take with a full glass of water daily.  If you experience loose stools or diarrhea, hold the colace until you stool forms back up. If your symptoms do not get better within 1 week or if they get worse, check with your doctor. Dulcolax (bisacodyl) - Pick up over-the-counter and take as directed by the product packaging as needed to assist with the movement of your bowels.  Take with a full glass of water.  Use this product as needed if not relieved by Colace only.  MiraLax (polyethylene glycol) - Pick up over-the-counter to have on hand. MiraLax is a solution that will increase the amount of water in your bowels to assist with bowel movements.  Take as directed and can mix with a glass of water, juice, soda, coffee, or tea. Take if you go more than two days without a movement. Do not use MiraLax more than once per day. Call your doctor if you are still constipated or irregular after using this medication for 7 days in a row.  If you continue  to have problems with postoperative constipation, please contact the office for further assistance and recommendations.  If you experience "the worst abdominal pain ever" or develop nausea or vomiting, please contact the office immediatly for further recommendations for treatment.  ITCHING If you experience itching with your medications, try taking only a single pain pill, or even half a pain pill at a time.  You can also use Benadryl over the counter for itching or also to help with sleep.   MEDICATIONS See your medication summary on the "After Visit Summary" that the nursing staff will review with you prior to discharge.  You may have some home medications which will be placed on hold until you complete the course of blood thinner medication.  It is important for you to complete the blood thinner medication as prescribed by your surgeon.  Continue your approved medications as instructed at time of discharge.  PRECAUTIONS If you experience chest pain or shortness of breath - call 911 immediately for transfer to the hospital emergency department.  If you develop a fever greater that   101 F, purulent drainage from wound, increased redness or drainage from wound, foul odor from the wound/dressing, or calf pain - CONTACT YOUR SURGEON.                                                   FOLLOW-UP APPOINTMENTS Make sure you keep all of your appointments after your operation with your surgeon and caregivers. You should call the office at the above phone number and make an appointment for approximately two weeks after the date of your surgery or on the date instructed by your surgeon outlined in the "After Visit Summary".  RANGE OF MOTION AND STRENGTHENING EXERCISES  Rehabilitation of the knee is important following a knee injury or an operation. After just a few days of immobilization, the muscles of the thigh which control the knee become weakened and shrink (atrophy). Knee exercises are designed to build up  the tone and strength of the thigh muscles and to improve knee motion. Often times heat used for twenty to thirty minutes before working out will loosen up your tissues and help with improving the range of motion but do not use heat for the first two weeks following surgery. These exercises can be done on a training (exercise) mat, on the floor, on a table or on a bed. Use what ever works the best and is most comfortable for you Knee exercises include:  Leg Lifts - While your knee is still immobilized in a splint or cast, you can do straight leg raises. Lift the leg to 60 degrees, hold for 3 sec, and slowly lower the leg. Repeat 10-20 times 2-3 times daily. Perform this exercise against resistance later as your knee gets better.  Quad and Hamstring Sets - Tighten up the muscle on the front of the thigh (Quad) and hold for 5-10 sec. Repeat this 10-20 times hourly. Hamstring sets are done by pushing the foot backward against an object and holding for 5-10 sec. Repeat as with quad sets.  Leg Slides: Lying on your back, slowly slide your foot toward your buttocks, bending your knee up off the floor (only go as far as is comfortable). Then slowly slide your foot back down until your leg is flat on the floor again. Angel Wings: Lying on your back spread your legs to the side as far apart as you can without causing discomfort.  A rehabilitation program following serious knee injuries can speed recovery and prevent re-injury in the future due to weakened muscles. Contact your doctor or a physical therapist for more information on knee rehabilitation.   POST-OPERATIVE OPIOID TAPER INSTRUCTIONS: It is important to wean off of your opioid medication as soon as possible. If you do not need pain medication after your surgery it is ok to stop day one. Opioids include: Codeine, Hydrocodone(Norco, Vicodin), Oxycodone(Percocet, oxycontin) and hydromorphone amongst others.  Long term and even short term use of opiods can  cause: Increased pain response Dependence Constipation Depression Respiratory depression And more.  Withdrawal symptoms can include Flu like symptoms Nausea, vomiting And more Techniques to manage these symptoms Hydrate well Eat regular healthy meals Stay active Use relaxation techniques(deep breathing, meditating, yoga) Do Not substitute Alcohol to help with tapering If you have been on opioids for less than two weeks and do not have pain than it is ok to stop all together.    Plan to wean off of opioids This plan should start within one week post op of your joint replacement. Maintain the same interval or time between taking each dose and first decrease the dose.  Cut the total daily intake of opioids by one tablet each day Next start to increase the time between doses. The last dose that should be eliminated is the evening dose.   IF YOU ARE TRANSFERRED TO A SKILLED REHAB FACILITY If the patient is transferred to a skilled rehab facility following release from the hospital, a list of the current medications will be sent to the facility for the patient to continue.  When discharged from the skilled rehab facility, please have the facility set up the patient's Home Health Physical Therapy prior to being released. Also, the skilled facility will be responsible for providing the patient with their medications at time of release from the facility to include their pain medication, the muscle relaxants, and their blood thinner medication. If the patient is still at the rehab facility at time of the two week follow up appointment, the skilled rehab facility will also need to assist the patient in arranging follow up appointment in our office and any transportation needs.  MAKE SURE YOU:  Understand these instructions.  Get help right away if you are not doing well or get worse.   DENTAL ANTIBIOTICS:  In most cases prophylactic antibiotics for Dental procdeures after total joint surgery are  not necessary.  Exceptions are as follows:  1. History of prior total joint infection  2. Severely immunocompromised (Organ Transplant, cancer chemotherapy, Rheumatoid biologic meds such as Humera)  3. Poorly controlled diabetes (A1C &gt; 8.0, blood glucose over 200)  If you have one of these conditions, contact your surgeon for an antibiotic prescription, prior to your dental procedure.    Pick up stool softner and laxative for home use following surgery while on pain medications. Do not submerge incision under water. Please use good hand washing techniques while changing dressing each day. May shower starting three days after surgery. Please use a clean towel to pat the incision dry following showers. Continue to use ice for pain and swelling after surgery. Do not use any lotions or creams on the incision until instructed by your surgeon.  

## 2023-05-22 NOTE — Transfer of Care (Signed)
Immediate Anesthesia Transfer of Care Note  Patient: Antonio Daniel  Procedure(s) Performed: TOTAL KNEE ARTHROPLASTY (Left: Knee)  Patient Location: PACU  Anesthesia Type:MAC  Level of Consciousness: awake and alert   Airway & Oxygen Therapy: Patient Spontanous Breathing  Post-op Assessment: Report given to RN  Post vital signs: Reviewed and stable  Last Vitals:  Vitals Value Taken Time  BP 125/69 05/22/23 1311  Temp 98.0   Pulse 66 05/22/23 1313  Resp 19 05/22/23 1312  SpO2 97 % 05/22/23 1313  Vitals shown include unfiled device data.  Last Pain:  Vitals:   05/22/23 1045  TempSrc:   PainSc: 0-No pain         Complications: No notable events documented.

## 2023-05-22 NOTE — Anesthesia Postprocedure Evaluation (Signed)
Anesthesia Post Note  Patient: SHARIEF WAINWRIGHT  Procedure(s) Performed: TOTAL KNEE ARTHROPLASTY (Left: Knee)     Patient location during evaluation: PACU Anesthesia Type: Spinal and Regional Level of consciousness: oriented and awake and alert Pain management: pain level controlled Vital Signs Assessment: post-procedure vital signs reviewed and stable Respiratory status: spontaneous breathing and respiratory function stable Cardiovascular status: blood pressure returned to baseline and stable Postop Assessment: no headache, no backache, no apparent nausea or vomiting, spinal receding and patient able to bend at knees Anesthetic complications: no  No notable events documented.  Last Vitals:  Vitals:   05/22/23 1400 05/22/23 1442  BP: 133/74 132/80  Pulse: (!) 56 60  Resp: 15 16  Temp:    SpO2: 99% 98%    Last Pain:  Vitals:   05/22/23 1442  TempSrc:   PainSc: 2                  Braxton Vantrease,W. EDMOND

## 2023-05-22 NOTE — Anesthesia Procedure Notes (Signed)
Spinal  Patient location during procedure: OR Start time: 05/22/2023 11:40 AM End time: 05/22/2023 11:45 AM Reason for block: surgical anesthesia Staffing Performed: anesthesiologist  Anesthesiologist: Gaynelle Adu, MD Performed by: Gaynelle Adu, MD Authorized by: Gaynelle Adu, MD   Preanesthetic Checklist Completed: patient identified, IV checked, risks and benefits discussed, surgical consent, monitors and equipment checked, pre-op evaluation and timeout performed Spinal Block Patient position: sitting Prep: DuraPrep Patient monitoring: cardiac monitor, continuous pulse ox and blood pressure Approach: midline Location: L3-4 Injection technique: single-shot Needle Needle type: Pencan  Needle gauge: 24 G Needle length: 9 cm Assessment Sensory level: T8 Events: CSF return Additional Notes Functioning IV was confirmed and monitors were applied. Sterile prep and drape, including hand hygiene and sterile gloves were used. The patient was positioned and the spine was prepped. The skin was anesthetized with lidocaine.  Free flow of clear CSF was obtained prior to injecting local anesthetic into the CSF.  The spinal needle aspirated freely following injection.  The needle was carefully withdrawn.  The patient tolerated the procedure well.

## 2023-05-22 NOTE — Care Plan (Signed)
Ortho Bundle Case Management Note  Patient Details  Name: Antonio Daniel MRN: [id number] Date of Birth: Jul 05, 1951                  L TKA on 05/22/23.  DCP: Home with wife.  DME: No needs. Has RW and cane.  PT: [facility] 11/7 at 8:00am.    DME Arranged:  N/A DME Agency:       Additional Comments: Please contact me with any questions of if this plan should need to change.    Despina Pole, CCM Case Manager, [facility]  [phone] 05/22/2023, 1:53 PM

## 2023-05-22 NOTE — Anesthesia Procedure Notes (Signed)
Anesthesia Regional Block: Adductor canal block   Pre-Anesthetic Checklist: , timeout performed,  Correct Patient, Correct Site, Correct Laterality,  Correct Procedure, Correct Position, site marked,  Risks and benefits discussed,  Pre-op evaluation,  At surgeon's request and post-op pain management  Laterality: Left  Prep: Maximum Sterile Barrier Precautions used, chloraprep       Needles:  Injection technique: Single-shot  Needle Type: Echogenic Stimulator Needle     Needle Length: 9cm  Needle Gauge: 21     Additional Needles:   Procedures:,,,, ultrasound used (permanent image in chart),,    Narrative:  Start time: 05/22/2023 10:32 AM End time: 05/22/2023 10:42 AM Injection made incrementally with aspirations every 5 mL. Anesthesiologist: Gaynelle Adu, MD

## 2023-05-22 NOTE — Progress Notes (Signed)
PT Note  Patient Details Name: Antonio Daniel MRN: [id number] DOB: 1951/01/17   Cancelled Treatment:    Reason Eval/Treat Not Completed: Pain limiting ability to participate. PT arrived at 18:58 for PT eval. Pt in bed. Pt reported that he pain medication he had been administered about 1.5 hrs prior was not working at all and that he was in too much pain (7/10 L knee). PT provided ed on pain management and communication with nursing staff. Pt is aware that PT eval will be conducted 11/5.PT to continue to follow acutely.   Antonio Daniel, PT Acute Rehab   Antonio Daniel 05/22/2023, 7:03 PM

## 2023-05-23 ENCOUNTER — Other Ambulatory Visit (HOSPITAL_COMMUNITY): Payer: Self-pay

## 2023-05-23 ENCOUNTER — Encounter (HOSPITAL_COMMUNITY): Payer: Self-pay | Admitting: Orthopedic Surgery

## 2023-05-23 DIAGNOSIS — M1712 Unilateral primary osteoarthritis, left knee: Secondary | ICD-10-CM | POA: Diagnosis not present

## 2023-05-23 LAB — CBC
HCT: 36.3 % — ABNORMAL LOW (ref 39.0–52.0)
Hemoglobin: 12.6 g/dL — ABNORMAL LOW (ref 13.0–17.0)
MCH: 31.7 pg (ref 26.0–34.0)
MCHC: 34.7 g/dL (ref 30.0–36.0)
MCV: 91.4 fL (ref 80.0–100.0)
Platelets: 168 10*3/uL (ref 150–400)
RBC: 3.97 MIL/uL — ABNORMAL LOW (ref 4.22–5.81)
RDW: 13.4 % (ref 11.5–15.5)
WBC: 15.7 10*3/uL — ABNORMAL HIGH (ref 4.0–10.5)
nRBC: 0 % (ref 0.0–0.2)

## 2023-05-23 LAB — BASIC METABOLIC PANEL
Anion gap: 10 (ref 5–15)
BUN: 14 mg/dL (ref 8–23)
CO2: 25 mmol/L (ref 22–32)
Calcium: 8.3 mg/dL — ABNORMAL LOW (ref 8.9–10.3)
Chloride: 103 mmol/L (ref 98–111)
Creatinine, Ser: 0.82 mg/dL (ref 0.61–1.24)
GFR, Estimated: >60 mL/min (ref >60)
Glucose, Bld: 146 mg/dL — ABNORMAL HIGH (ref 70–99)
Potassium: 3.9 mmol/L (ref 3.5–5.1)
Sodium: 138 mmol/L (ref 135–145)

## 2023-05-23 MED ORDER — ONDANSETRON HCL 4 MG PO TABS
4.0000 mg | ORAL_TABLET | Freq: Four times a day (QID) | ORAL | 0 refills | Status: DC | PRN
  Filled 2023-05-23: qty 20, 5d supply, fill #0

## 2023-05-23 MED ORDER — TRAMADOL HCL 50 MG PO TABS
50.0000 mg | ORAL_TABLET | Freq: Four times a day (QID) | ORAL | 0 refills | Status: DC | PRN
  Filled 2023-05-23: qty 40, 5d supply, fill #0

## 2023-05-23 MED ORDER — METHOCARBAMOL 500 MG PO TABS
500.0000 mg | ORAL_TABLET | Freq: Four times a day (QID) | ORAL | 0 refills | Status: DC | PRN
  Filled 2023-05-23: qty 40, 10d supply, fill #0

## 2023-05-23 MED ORDER — HYDROMORPHONE HCL 2 MG PO TABS
1.0000 mg | ORAL_TABLET | Freq: Four times a day (QID) | ORAL | 0 refills | Status: DC | PRN
  Filled 2023-05-23: qty 21, 6d supply, fill #0

## 2023-05-23 MED ORDER — ASPIRIN 81 MG PO CHEW
81.0000 mg | CHEWABLE_TABLET | Freq: Two times a day (BID) | ORAL | 0 refills | Status: AC
  Filled 2023-05-23: qty 63, 32d supply, fill #0

## 2023-05-23 NOTE — Evaluation (Signed)
Physical Therapy Evaluation Patient Details Name: ARDA KEADLE MRN: [id number] DOB: 1951/06/07 Today's Date: 05/23/2023  History of Present Illness  72 yo male presents to therapy s/p L TKA on 05/22/2023 due to failure of conservative measures. Pt PMH includes but is not limited to: schatzki's ring, hiatal hernia, dysphagia, autoimmune thyroiditis, GERD, R RTC tear, back surgery, and R TKA (2014).  Clinical Impression   BRANSEN FASSNACHT is a 72 y.o. male POD 1 s/p L TKA. Patient reports IND with mobility at baseline. Patient is now limited by functional impairments (see PT problem list below) and requires CGA for bed mobility and CGA and cues for transfers. Patient was able to ambulate 160 feet with RW and CGA level of assist. Patient instructed in exercise to facilitate ROM and circulation to manage edema. Pt indicated his pain was much better managed this mornings last night and ed provided on pain management. Patient will benefit from continued skilled PT interventions to address impairments and progress towards PLOF. Acute PT will follow to progress mobility and stair training in preparation for safe discharge home with family support and OPPT services scheduled for 11/7.        If plan is discharge home, recommend the following: A little help with walking and/or transfers;A little help with bathing/dressing/bathroom;Assistance with cooking/housework;Assist for transportation;Help with stairs or ramp for entrance   Can travel by private vehicle        Equipment Recommendations None recommended by PT  Recommendations for Other Services       Functional Status Assessment Patient has had a recent decline in their functional status and demonstrates the ability to make significant improvements in function in a reasonable and predictable amount of time.     Precautions / Restrictions Precautions Precautions: Knee;Fall Restrictions Weight Bearing Restrictions: No      Mobility  Bed  Mobility Overal bed mobility: Needs Assistance Bed Mobility: Supine to Sit     Supine to sit: Supervision, HOB elevated     General bed mobility comments: min cues    Transfers Overall transfer level: Needs assistance Equipment used: Rolling walker (2 wheels) Transfers: Sit to/from Stand Sit to Stand: Contact guard assist           General transfer comment: min cues for proper UE and AD placement with pt able to push to stand from EOB    Ambulation/Gait Ambulation/Gait assistance: Contact guard assist Gait Distance (Feet): 160 Feet Assistive device: Rolling walker (2 wheels) Gait Pattern/deviations: Step-to pattern, Antalgic       General Gait Details: noted L toe out with gait tasks, slight L knee flexion throughout gait cycle and use of B UE support at RW to offload L LE in stance phase min cues for safety and proper body position  Stairs            Wheelchair Mobility     Tilt Bed    Modified Rankin (Stroke Patients Only)       Balance Overall balance assessment: Needs assistance Sitting-balance support: Feet supported Sitting balance-Leahy Scale: Good     Standing balance support: Bilateral upper extremity supported, During functional activity, Reliant on assistive device for balance Standing balance-Leahy Scale: Poor                               Pertinent Vitals/Pain Pain Assessment Pain Assessment: 0-10 Pain Score: 2  Pain Location: L knee Pain Descriptors / Indicators: Aching, Constant,  Discomfort, Grimacing, Operative site guarding Pain Intervention(s): Limited activity within patient's tolerance, Monitored during session, Premedicated before session, Repositioned, Ice applied    Home Living Family/patient expects to be discharged to:: Private residence Living Arrangements: Spouse/significant other Available Help at Discharge: Family Type of Home: House Home Access: Level entry       Home Layout: One level Home  Equipment: Agricultural consultant (2 wheels);Cane - single point;Crutches;Grab bars - toilet;Grab bars - tub/shower;Wheelchair - Public librarian (4 wheels) Additional Comments: farming and retired Psychologist, occupational    Prior Function Prior Level of Function : Independent/Modified Independent;Driving             Mobility Comments: IND with all ADLs self care tasks, IADLs, no AD       Extremity/Trunk Assessment   Upper Extremity Assessment Upper Extremity Assessment: LUE deficits/detail LUE Deficits / Details: ankle DF/PF 5/5; SLR < 10 degree lag LUE Sensation: WNL         Cervical / Trunk Assessment Cervical / Trunk Assessment: Back Surgery  Communication   Communication Communication: No apparent difficulties  Cognition Arousal: Alert Behavior During Therapy: WFL for tasks assessed/performed Overall Cognitive Status: Within Functional Limits for tasks assessed                                          General Comments      Exercises Total Joint Exercises Ankle Circles/Pumps: AROM, Both, 10 reps Quad Sets: AROM, Left, 5 reps Short Arc Quad: AROM, Left, 5 reps Heel Slides: AROM, Left, 5 reps Hip ABduction/ADduction: AROM, Left, 5 reps Straight Leg Raises: AROM, Left, 5 reps Knee Flexion: AROM, Left, 5 reps, Seated   Assessment/Plan    PT Assessment Patient needs continued PT services  PT Problem List Decreased strength;Decreased range of motion;Decreased activity tolerance;Decreased balance;Decreased mobility;Decreased coordination;Pain       PT Treatment Interventions DME instruction;Gait training;Functional mobility training;Therapeutic activities;Therapeutic exercise;Balance training;Neuromuscular re-education;Patient/family education;Modalities    PT Goals (Current goals can be found in the Care Plan section)  Acute Rehab PT Goals 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 PT Goal Formulation: With patient Time For Goal  Achievement: 06/06/23 Potential to Achieve Goals: Good    Frequency 7X/week     Co-evaluation               AM-PAC PT "6 Clicks" Mobility  Outcome Measure Help needed turning from your back to your side while in a flat bed without using bedrails?: None Help needed moving from lying on your back to sitting on the side of a flat bed without using bedrails?: A Little Help needed moving to and from a bed to a chair (including a wheelchair)?: A Little Help needed standing up from a chair using your arms (e.g., wheelchair or bedside chair)?: A Little Help needed to walk in hospital room?: A Little Help needed climbing 3-5 steps with a railing? : A Lot 6 Click Score: 18    End of Session Equipment Utilized During Treatment: Gait belt Activity Tolerance: Patient tolerated treatment well;No increased pain Patient left: in chair;with call bell/phone within reach Nurse Communication: Mobility status PT Visit Diagnosis: Unsteadiness on feet (R26.81);Other abnormalities of gait and mobility (R26.89);Muscle weakness (generalized) (M62.81);Difficulty in walking, not elsewhere classified (R26.2);Pain Pain - Right/Left: Left Pain - part of body: Knee;Leg    Time: 4098-1191 PT Time Calculation (min) (ACUTE ONLY): 25  min   Charges:   PT Evaluation $PT Eval Low Complexity: 1 Low PT Treatments $Gait Training: 8-22 mins PT General Charges $$ ACUTE PT VISIT: 1 Visit         Johnny Bridge, PT Acute Rehab   Jacqualyn Posey 05/23/2023, 10:42 AM

## 2023-05-23 NOTE — TOC Transition Note (Signed)
Transition of Care [facility]) - CM/SW Discharge Note   Patient Details  Name: Antonio Daniel MRN: [id number] Date of Birth: [birth year]  Transition of Care [facility]) CM/SW Contact:  Amada Jupiter, LCSW Phone Number: 05/23/2023, 12:54 PM   Clinical Narrative:     Met with pt who confirms he has needed DME in the home.  OPPT already arranged at [facility].  No further TOC needs.  Final next level of care: OP Rehab Barriers to Discharge: No Barriers Identified   Patient Goals and CMS Choice      Discharge Placement                         Discharge Plan and Services Additional resources added to the After Visit Summary for                  DME Arranged: N/A DME Agency: NA                  Social Determinants of Health (SDOH) Interventions SDOH Screenings   Food Insecurity: No Food Insecurity (05/22/2023)  Housing: Low Risk  (05/22/2023)  Transportation Needs: No Transportation Needs (05/22/2023)  Utilities: Not At Risk (05/22/2023)  Tobacco Use: Low Risk  (05/22/2023)     Readmission Risk Interventions     No data to display

## 2023-05-23 NOTE — Progress Notes (Signed)
Physical Therapy Treatment Patient Details Name: GARION WEMPE MRN: [id number] DOB: 1951/04/04 Today's Date: 05/23/2023   History of Present Illness 72 yo male presents to therapy s/p L TKA on 05/22/2023 due to failure of conservative measures. Pt PMH includes but is not limited to: schatzki's ring, hiatal hernia, dysphagia, autoimmune thyroiditis, GERD, R RTC tear, back surgery, and R TKA (2014).    PT Comments   RYHEEM JAY is a 72 y.o. male POD 1 s/p L TKA. Patient reports IND with mobility at baseline. Patient is now limited by functional impairments (see PT problem list below). Pt in bed and agreeable to PM PT tx session. Pt is mod I for bed mobility and S and min cues for transfers. Patient was able to ambulate 200 feet with RW and S level of assist. Patient instructed in exercise to facilitate ROM and circulation to manage edema verbally reviewed and HO provided. Pt indicated his pain remains manageable at a 2/10. Pt ed provided on safety, fall risk prevention, use of RW and car transfers pt demonstrated verbal understanding. Patient will benefit from continued skilled PT interventions to address impairments and progress towards PLOF. Pt has demonstratedprogress with functional mobility and pt is now ready for discharge home with family support from a PT standpoint and all goals met. Pt will benefit from ongoing therapy and OPPT services scheduled for 11/7.      If plan is discharge home, recommend the following: A little help with walking and/or transfers;A little help with bathing/dressing/bathroom;Assistance with cooking/housework;Assist for transportation;Help with stairs or ramp for entrance   Can travel by private vehicle        Equipment Recommendations  None recommended by PT    Recommendations for Other Services       Precautions / Restrictions Precautions Precautions: Knee;Fall Restrictions Weight Bearing Restrictions: No     Mobility  Bed Mobility Overal bed  mobility: Needs Assistance Bed Mobility: Supine to Sit, Sit to Supine     Supine to sit: Modified independent (Device/Increase time) Sit to supine: Modified independent (Device/Increase time)   General bed mobility comments: bed flat    Transfers Overall transfer level: Needs assistance Equipment used: Rolling walker (2 wheels) Transfers: Sit to/from Stand Sit to Stand: Supervision           General transfer comment: min cues from EOB    Ambulation/Gait Ambulation/Gait assistance: Supervision Gait Distance (Feet): 200 Feet Assistive device: Rolling walker (2 wheels) Gait Pattern/deviations: Step-to pattern, Antalgic Gait velocity: decreased     General Gait Details: noted L toe out with gait tasks, slight L knee flexion throughout gait cycle with decreased L stance time and occational poor R foot clearance with pt able to progress to step almost through pattern   Stairs             Wheelchair Mobility     Tilt Bed    Modified Rankin (Stroke Patients Only)       Balance Overall balance assessment: Needs assistance Sitting-balance support: Feet supported Sitting balance-Leahy Scale: Good     Standing balance support: Bilateral upper extremity supported, During functional activity, Reliant on assistive device for balance Standing balance-Leahy Scale: Fair Standing balance comment: static standing no UE support                            Cognition Arousal: Alert Behavior During Therapy: WFL for tasks assessed/performed Overall Cognitive Status: Within Functional Limits for tasks  assessed                                          Exercises Total Joint Exercises Ankle Circles/Pumps: AROM, Both, 10 reps Quad Sets: AROM, Left, 5 reps Short Arc Quad: AROM, Left, 5 reps Heel Slides: AROM, Left, 5 reps Hip ABduction/ADduction: AROM, Left, 5 reps Straight Leg Raises: AROM, Left, 5 reps Knee Flexion: AROM, Left, 5 reps,  Seated    General Comments        Pertinent Vitals/Pain Pain Assessment Pain Assessment: 0-10 Pain Score: 2  Pain Location: L knee Pain Descriptors / Indicators: Aching, Constant, Discomfort, Grimacing, Operative site guarding Pain Intervention(s): Monitored during session, Repositioned, Ice applied    Home Living Family/patient expects to be discharged to:: Private residence Living Arrangements: Spouse/significant other Available Help at Discharge: Family Type of Home: House Home Access: Level entry       Home Layout: One level Home Equipment: Agricultural consultant (2 wheels);Cane - single point;Crutches;Grab bars - toilet;Grab bars - tub/shower;Wheelchair - Public librarian (4 wheels) Additional Comments: farming and retired Psychologist, occupational    Prior Function            PT Goals (current goals can now be found in the care plan section) Acute Rehab PT Goals 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 PT Goal Formulation: With patient Time For Goal Achievement: 06/06/23 Potential to Achieve Goals: Good Progress towards PT goals: Progressing toward goals    Frequency    7X/week      PT Plan      Co-evaluation              AM-PAC PT "6 Clicks" Mobility   Outcome Measure  Help needed turning from your back to your side while in a flat bed without using bedrails?: None Help needed moving from lying on your back to sitting on the side of a flat bed without using bedrails?: None Help needed moving to and from a bed to a chair (including a wheelchair)?: A Little Help needed standing up from a chair using your arms (e.g., wheelchair or bedside chair)?: A Little Help needed to walk in hospital room?: A Little Help needed climbing 3-5 steps with a railing? : A Lot 6 Click Score: 19    End of Session Equipment Utilized During Treatment: Gait belt Activity Tolerance: Patient tolerated treatment well;No increased pain Patient left: with call  bell/phone within reach;in bed Nurse Communication: Mobility status (pt readiness for d/c from PT standpoint) PT Visit Diagnosis: Unsteadiness on feet (R26.81);Other abnormalities of gait and mobility (R26.89);Muscle weakness (generalized) (M62.81);Difficulty in walking, not elsewhere classified (R26.2);Pain Pain - Right/Left: Left Pain - part of body: Knee;Leg     Time: 4010-2725 PT Time Calculation (min) (ACUTE ONLY): 18 min  Charges:    $Gait Training: 8-22 mins PT General Charges $$ ACUTE PT VISIT: 1 Visit                     Johnny Bridge, PT Acute Rehab    Jacqualyn Posey 05/23/2023, 1:20 PM

## 2023-05-23 NOTE — Progress Notes (Signed)
Patient discharged to home w/ wife. Given all belongings, instructions, medications. Verbalized understanding of all instructions. Escorted to pov via w/c.

## 2023-05-23 NOTE — Care Management Obs Status (Signed)
MEDICARE OBSERVATION STATUS NOTIFICATION   Patient Details  Name: Antonio Daniel MRN: [id number] Date of Birth: [birth year]   Medicare Observation Status Notification Given:  Yes  Pt declined signature  Dinah Lupa, LCSW 05/23/2023, 1:28 PM

## 2023-05-23 NOTE — Progress Notes (Signed)
Subjective: 1 Day Post-Op Procedure(s) (LRB): TOTAL KNEE ARTHROPLASTY (Left) Patient reports pain as mild.   Patient seen in rounds by Dr. Lequita Halt. Patient is well, and has had no acute complaints or problems No issues overnight. Denies chest pain, SOB, or calf pain. Foley catheter removed this AM.  We will begin therapy today  Objective: Vital signs in last 24 hours: Temp:  [97.6 F (36.4 C)-98.4 F (36.9 C)] 98.2 F (36.8 C) (11/05 0558) Pulse Rate:  [56-81] 81 (11/05 0558) Resp:  [11-19] 18 (11/05 0558) BP: (112-137)/(62-93) 137/74 (11/05 0558) SpO2:  [96 %-100 %] 97 % (11/05 0558) Weight:  [76.2 kg] 76.2 kg (11/04 0935)  Intake/Output from previous day:  Intake/Output Summary (Last 24 hours) at 05/23/2023 0855 Last data filed at 05/23/2023 0600 Gross per 24 hour  Intake 2556.89 ml  Output 1450 ml  Net 1106.89 ml     Intake/Output this shift: No intake/output data recorded.  Labs: Recent Labs    05/23/23 0329  HGB 12.6*   Recent Labs    05/23/23 0329  WBC 15.7*  RBC 3.97*  HCT 36.3*  PLT 168   Recent Labs    05/23/23 0329  NA 138  K 3.9  CL 103  CO2 25  BUN 14  CREATININE 0.82  GLUCOSE 146*  CALCIUM 8.3*   No results for input(s): "LABPT", "INR" in the last 72 hours.  Exam: General - Patient is Alert and Oriented Extremity - Neurologically intact Neurovascular intact Sensation intact distally Dorsiflexion/Plantar flexion intact Dressing - dressing C/D/I Motor Function - intact, moving foot and toes well on exam.   Past Medical History:  Diagnosis Date   Arthritis    oa rt knee and arthritis all over   Autoimmune thyroiditis    Complication of anesthesia    fentanyl went into cardic arrest at surgical arrest   GERD (gastroesophageal reflux disease)    History of hiatal hernia    History of kidney infection    History of pelvic fracture    in 3 places per pt no surgery   Pneumonia    hx of in 1970s   PONV (postoperative nausea and  vomiting)    Primary osteoarthritis    Rotator cuff tear, right     Assessment/Plan: 1 Day Post-Op Procedure(s) (LRB): TOTAL KNEE ARTHROPLASTY (Left) Principal Problem:   OA (osteoarthritis) of knee Active Problems:   Primary osteoarthritis of left knee  Estimated body mass index is 24.11 kg/m as calculated from the following:   Height as of this encounter: 5\' 10"  (1.778 m).   Weight as of this encounter: 76.2 kg. Advance diet Up with therapy D/C IV fluids   Patient's anticipated LOS is less than 2 midnights, meeting these requirements: - Lives within 1 hour of care - Has a competent adult at home to recover with post-op  - NO history of             - Chronic pain requiring opiods             - Diabetes             - Coronary Artery Disease             - Heart failure             - Heart attack             - Stroke             - DVT/VTE             -  Cardiac arrhythmia             - Respiratory Failure/COPD             - Renal failure             - Anemia             - Advanced Liver disease     DVT Prophylaxis - Aspirin Weight bearing as tolerated. Continue therapy.  Plan is to go Home after hospital stay. Plan for discharge later today if progresses with therapy and meeting goals. Scheduled for OPPT at [facility]). Follow-up in the office in 2 weeks.  The PDMP database was reviewed today prior to any opioid medications being prescribed to this patient.  Arther Abbott, PA-C Orthopedic Surgery [phone] 05/23/2023, 8:55 AM

## 2023-05-24 NOTE — Discharge Summary (Signed)
Patient ID: Antonio Daniel MRN: [id number] DOB/AGE: 1951-03-17 72 y.o.  Admit date: 05/22/2023 Discharge date: 05/23/2023  Admission Diagnoses:  Principal Problem:   OA (osteoarthritis) of knee Active Problems:   Primary osteoarthritis of left knee   Discharge Diagnoses:  Same  Past Medical History:  Diagnosis Date   Arthritis    oa rt knee and arthritis all over   Autoimmune thyroiditis    Complication of anesthesia    fentanyl went into cardic arrest at surgical arrest   GERD (gastroesophageal reflux disease)    History of hiatal hernia    History of kidney infection    History of pelvic fracture    in 3 places per pt no surgery   Pneumonia    hx of in 1970s   PONV (postoperative nausea and vomiting)    Primary osteoarthritis    Rotator cuff tear, right     Surgeries: Procedure(s): TOTAL KNEE ARTHROPLASTY on 05/22/2023   Consultants:   Discharged Condition: Improved  Hospital Course: Antonio Daniel is an 72 y.o. male who was admitted 05/22/2023 for operative treatment ofOA (osteoarthritis) of knee. Patient has severe unremitting pain that affects sleep, daily activities, and work/hobbies. After pre-op clearance the patient was taken to the operating room on 05/22/2023 and underwent  Procedure(s): TOTAL KNEE ARTHROPLASTY.    Patient was given perioperative antibiotics:  Anti-infectives (From admission, onward)    Start     Dose/Rate Route Frequency Ordered Stop   05/22/23 1800  ceFAZolin (ANCEF) IVPB 2g/100 mL premix        2 g 200 mL/hr over 30 Minutes Intravenous Every 6 hours 05/22/23 1430 05/22/23 2352   05/22/23 0930  ceFAZolin (ANCEF) IVPB 2g/100 mL premix        2 g 200 mL/hr over 30 Minutes Intravenous On call to O.R. 05/22/23 2409 05/22/23 1152        Patient was given sequential compression devices, early ambulation, and chemoprophylaxis to prevent DVT.  Patient benefited maximally from hospital stay and there were no complications.    Recent  vital signs: Patient Vitals for the past 24 hrs:  BP Temp Temp src Pulse Resp SpO2  05/23/23 1413 107/60 98.7 F (37.1 C) Oral 74 18 94 %     Recent laboratory studies:  Recent Labs    05/23/23 0329  WBC 15.7*  HGB 12.6*  HCT 36.3*  PLT 168  NA 138  K 3.9  CL 103  CO2 25  BUN 14  CREATININE 0.82  GLUCOSE 146*  CALCIUM 8.3*     Discharge Medications:   Allergies as of 05/23/2023       Reactions   Fentanyl Anaphylaxis   Oxycontin [oxycodone] Nausea And Vomiting        Medication List     STOP taking these medications    ibuprofen 800 MG tablet Commonly known as: ADVIL   sucralfate 1 g tablet Commonly known as: Carafate       TAKE these medications    Aspirin Low Dose 81 MG chewable tablet Generic drug: aspirin Chew 1 tablet (81 mg total) by mouth 2 (two) times daily for 21 days. Then take one 81 mg aspirin once a day for three weeks. Then discontinue aspirin.   HYDROmorphone 2 MG tablet Commonly known as: DILAUDID Take 0.5-1 tablets (1-2 mg total) by mouth every 6 (six) hours as needed for severe pain (pain score 7-10).   methocarbamol 500 MG tablet Commonly known as: ROBAXIN Take 1 tablet (500  mg total) by mouth every 6 (six) hours as needed for muscle spasms.   omeprazole 20 MG capsule Commonly known as: PRILOSEC Take 1 capsule (20 mg total) by mouth 2 (two) times daily before a meal. What changed:  when to take this reasons to take this   ondansetron 4 MG tablet Commonly known as: ZOFRAN Take 1 tablet (4 mg total) by mouth every 6 (six) hours as needed for nausea.   traMADol 50 MG tablet Commonly known as: ULTRAM Take 1-2 tablets (50-100 mg total) by mouth every 6 (six) hours as needed for moderate pain (pain score 4-6).               Discharge Care Instructions  (From admission, onward)           Start     Ordered   05/23/23 0000  Weight bearing as tolerated        05/23/23 0857   05/23/23 0000  Change dressing        Comments: You may remove the bulky bandage (ACE wrap and gauze) two days after surgery. You will have an adhesive waterproof bandage underneath. Leave this in place until your first follow-up appointment.   05/23/23 0857            Diagnostic Studies: No results found.  Disposition: Discharge disposition: 01-Home or Self Care       Discharge Instructions     Call MD / Call 911   Complete by: As directed    If you experience chest pain or shortness of breath, CALL 911 and be transported to the hospital emergency room.  If you develope a fever above 101 F, pus (white drainage) or increased drainage or redness at the wound, or calf pain, call your surgeon's office.   Change dressing   Complete by: As directed    You may remove the bulky bandage (ACE wrap and gauze) two days after surgery. You will have an adhesive waterproof bandage underneath. Leave this in place until your first follow-up appointment.   Constipation Prevention   Complete by: As directed    Drink plenty of fluids.  Prune juice may be helpful.  You may use a stool softener, such as Colace (over the counter) 100 mg twice a day.  Use MiraLax (over the counter) for constipation as needed.   Diet - low sodium heart healthy   Complete by: As directed    Do not put a pillow under the knee. Place it under the heel.   Complete by: As directed    Driving restrictions   Complete by: As directed    No driving for two weeks   Post-operative opioid taper instructions:   Complete by: As directed    POST-OPERATIVE OPIOID TAPER INSTRUCTIONS: It is important to wean off of your opioid medication as soon as possible. If you do not need pain medication after your surgery it is ok to stop day one. Opioids include: Codeine, Hydrocodone(Norco, Vicodin), Oxycodone(Percocet, oxycontin) and hydromorphone amongst others.  Long term and even short term use of opiods can cause: Increased pain  response Dependence Constipation Depression Respiratory depression And more.  Withdrawal symptoms can include Flu like symptoms Nausea, vomiting And more Techniques to manage these symptoms Hydrate well Eat regular healthy meals Stay active Use relaxation techniques(deep breathing, meditating, yoga) Do Not substitute Alcohol to help with tapering If you have been on opioids for less than two weeks and do not have pain than it is ok to  stop all together.  Plan to wean off of opioids This plan should start within one week post op of your joint replacement. Maintain the same interval or time between taking each dose and first decrease the dose.  Cut the total daily intake of opioids by one tablet each day Next start to increase the time between doses. The last dose that should be eliminated is the evening dose.      TED hose   Complete by: As directed    Use stockings (TED hose) for three weeks on both leg(s).  You may remove them at night for sleeping.   Weight bearing as tolerated   Complete by: As directed         Follow-up Information     Ollen Gross, MD. Go on 06/06/2023.   Specialty: Orthopedic Surgery Why: You are scheduled for first post op appt on Tuesday Nov 19 at 1:30pm. Contact information: [address] [place] [address] [phone]                  Signed: Arther Abbott 05/24/2023, 12:14 PM

## 2023-05-25 DIAGNOSIS — M25562 Pain in left knee: Secondary | ICD-10-CM | POA: Diagnosis not present

## 2023-05-25 DIAGNOSIS — M25662 Stiffness of left knee, not elsewhere classified: Secondary | ICD-10-CM | POA: Diagnosis not present

## 2023-05-29 DIAGNOSIS — M25562 Pain in left knee: Secondary | ICD-10-CM | POA: Diagnosis not present

## 2023-05-29 DIAGNOSIS — M25662 Stiffness of left knee, not elsewhere classified: Secondary | ICD-10-CM | POA: Diagnosis not present

## 2023-05-31 DIAGNOSIS — M25562 Pain in left knee: Secondary | ICD-10-CM | POA: Diagnosis not present

## 2023-05-31 DIAGNOSIS — M25662 Stiffness of left knee, not elsewhere classified: Secondary | ICD-10-CM | POA: Diagnosis not present

## 2023-06-02 DIAGNOSIS — M25662 Stiffness of left knee, not elsewhere classified: Secondary | ICD-10-CM | POA: Diagnosis not present

## 2023-06-02 DIAGNOSIS — M25562 Pain in left knee: Secondary | ICD-10-CM | POA: Diagnosis not present

## 2023-06-20 DIAGNOSIS — Z5189 Encounter for other specified aftercare: Secondary | ICD-10-CM | POA: Diagnosis not present

## 2023-06-30 DIAGNOSIS — G5622 Lesion of ulnar nerve, left upper limb: Secondary | ICD-10-CM | POA: Diagnosis not present

## 2023-08-02 DIAGNOSIS — G5622 Lesion of ulnar nerve, left upper limb: Secondary | ICD-10-CM | POA: Diagnosis not present

## 2023-08-04 DIAGNOSIS — M13842 Other specified arthritis, left hand: Secondary | ICD-10-CM | POA: Diagnosis not present

## 2023-08-04 DIAGNOSIS — G5622 Lesion of ulnar nerve, left upper limb: Secondary | ICD-10-CM | POA: Diagnosis not present

## 2023-08-09 ENCOUNTER — Other Ambulatory Visit: Payer: Self-pay

## 2023-08-09 ENCOUNTER — Encounter (HOSPITAL_BASED_OUTPATIENT_CLINIC_OR_DEPARTMENT_OTHER): Payer: Self-pay | Admitting: Orthopedic Surgery

## 2023-08-15 ENCOUNTER — Ambulatory Visit (HOSPITAL_BASED_OUTPATIENT_CLINIC_OR_DEPARTMENT_OTHER)
Admission: RE | Admit: 2023-08-15 | Discharge: 2023-08-16 | Disposition: A | Discharge status: Home / Self Care | Payer: PPO | Attending: Orthopedic Surgery | Admitting: Orthopedic Surgery

## 2023-08-15 DIAGNOSIS — K222 Esophageal obstruction: Secondary | ICD-10-CM | POA: Insufficient documentation

## 2023-08-15 DIAGNOSIS — Z01818 Encounter for other preprocedural examination: Secondary | ICD-10-CM

## 2023-08-15 DIAGNOSIS — M1990 Unspecified osteoarthritis, unspecified site: Secondary | ICD-10-CM | POA: Insufficient documentation

## 2023-08-15 DIAGNOSIS — K449 Diaphragmatic hernia without obstruction or gangrene: Secondary | ICD-10-CM | POA: Insufficient documentation

## 2023-08-15 DIAGNOSIS — G5602 Carpal tunnel syndrome, left upper limb: Secondary | ICD-10-CM | POA: Insufficient documentation

## 2023-08-15 DIAGNOSIS — K219 Gastro-esophageal reflux disease without esophagitis: Secondary | ICD-10-CM | POA: Insufficient documentation

## 2023-08-15 DIAGNOSIS — G5622 Lesion of ulnar nerve, left upper limb: Secondary | ICD-10-CM | POA: Insufficient documentation

## 2023-08-16 ENCOUNTER — Encounter (HOSPITAL_BASED_OUTPATIENT_CLINIC_OR_DEPARTMENT_OTHER)
Admission: RE | Disposition: A | Discharge status: Home / Self Care | Payer: Self-pay | Source: Home / Self Care | Attending: Orthopedic Surgery

## 2023-08-16 ENCOUNTER — Ambulatory Visit (HOSPITAL_BASED_OUTPATIENT_CLINIC_OR_DEPARTMENT_OTHER): Payer: Self-pay | Admitting: Anesthesiology

## 2023-08-16 ENCOUNTER — Other Ambulatory Visit: Payer: Self-pay

## 2023-08-16 ENCOUNTER — Encounter (HOSPITAL_BASED_OUTPATIENT_CLINIC_OR_DEPARTMENT_OTHER): Payer: Self-pay | Admitting: Orthopedic Surgery

## 2023-08-16 DIAGNOSIS — G5622 Lesion of ulnar nerve, left upper limb: Secondary | ICD-10-CM | POA: Diagnosis not present

## 2023-08-16 DIAGNOSIS — K222 Esophageal obstruction: Secondary | ICD-10-CM | POA: Diagnosis not present

## 2023-08-16 DIAGNOSIS — K219 Gastro-esophageal reflux disease without esophagitis: Secondary | ICD-10-CM | POA: Diagnosis not present

## 2023-08-16 DIAGNOSIS — M1990 Unspecified osteoarthritis, unspecified site: Secondary | ICD-10-CM | POA: Diagnosis not present

## 2023-08-16 DIAGNOSIS — G5602 Carpal tunnel syndrome, left upper limb: Secondary | ICD-10-CM | POA: Diagnosis present

## 2023-08-16 DIAGNOSIS — Z01818 Encounter for other preprocedural examination: Secondary | ICD-10-CM

## 2023-08-16 DIAGNOSIS — K449 Diaphragmatic hernia without obstruction or gangrene: Secondary | ICD-10-CM | POA: Diagnosis not present

## 2023-08-16 HISTORY — PX: APPLICATION OF CELL SAVER: SHX7307

## 2023-08-16 HISTORY — PX: CARPAL TUNNEL RELEASE: SHX101

## 2023-08-16 SURGERY — NERVE TUNNEL DECOMPRESSION, UPPER EXTREMITY
Anesthesia: Monitor Anesthesia Care | Site: Hand | Laterality: Left

## 2023-08-16 MED ORDER — LACTATED RINGERS IV SOLN: INTRAVENOUS | Status: DC

## 2023-08-16 MED ORDER — MIDAZOLAM HCL 2 MG/2ML IJ SOLN
INTRAMUSCULAR | Status: AC
  Filled 2023-08-16: qty 2

## 2023-08-16 MED ORDER — 0.9 % SODIUM CHLORIDE (POUR BTL) OPTIME
TOPICAL | Status: DC | PRN
  Administered 2023-08-16: 250 mL
  Administered 2023-08-16: 100 mL

## 2023-08-16 MED ORDER — HYDROMORPHONE HCL 1 MG/ML IJ SOLN: 0.2500 mg | INTRAMUSCULAR | Status: DC | PRN

## 2023-08-16 MED ORDER — PROPOFOL 500 MG/50ML IV EMUL
INTRAVENOUS | Status: DC | PRN
  Administered 2023-08-16: 125 ug/kg/min via INTRAVENOUS

## 2023-08-16 MED ORDER — ACETAMINOPHEN 500 MG PO TABS
ORAL_TABLET | ORAL | Status: AC
Start: 2023-08-16 — End: ?
  Filled 2023-08-16: qty 2

## 2023-08-16 MED ORDER — ACETAMINOPHEN 500 MG PO TABS
1000.0000 mg | ORAL_TABLET | Freq: Once | ORAL | Status: AC
  Administered 2023-08-16: 1000 mg via ORAL

## 2023-08-16 MED ORDER — CEFAZOLIN SODIUM-DEXTROSE 2-4 GM/100ML-% IV SOLN
2.0000 g | INTRAVENOUS | Status: AC
  Administered 2023-08-16: 2 g via INTRAVENOUS

## 2023-08-16 MED ORDER — AMISULPRIDE (ANTIEMETIC) 5 MG/2ML IV SOLN: 10.0000 mg | Freq: Once | INTRAVENOUS | Status: DC | PRN

## 2023-08-16 MED ORDER — CEFAZOLIN SODIUM-DEXTROSE 2-4 GM/100ML-% IV SOLN
INTRAVENOUS | Status: AC
  Filled 2023-08-16: qty 100

## 2023-08-16 MED ORDER — MIDAZOLAM HCL 2 MG/2ML IJ SOLN
2.0000 mg | Freq: Once | INTRAMUSCULAR | Status: AC
  Administered 2023-08-16: 2 mg via INTRAVENOUS

## 2023-08-16 MED ORDER — LIDOCAINE HCL (CARDIAC) PF 100 MG/5ML IV SOSY
PREFILLED_SYRINGE | INTRAVENOUS | Status: DC | PRN
  Administered 2023-08-16: 40 mg via INTRAVENOUS

## 2023-08-16 MED ORDER — ROPIVACAINE HCL 5 MG/ML IJ SOLN
INTRAMUSCULAR | Status: DC | PRN
  Administered 2023-08-16: 20 mL via PERINEURAL

## 2023-08-16 MED ORDER — MEPIVACAINE HCL (PF) 2 % IJ SOLN
INTRAMUSCULAR | Status: DC | PRN
  Administered 2023-08-16: 5 mL

## 2023-08-16 SURGICAL SUPPLY — 43 items
BLADE SURG 15 STRL LF DISP TIS (BLADE) ×2 IMPLANT
BNDG ELASTIC 3INX 5YD STR LF (GAUZE/BANDAGES/DRESSINGS) ×2 IMPLANT
BNDG ESMARK 4X9 LF (GAUZE/BANDAGES/DRESSINGS) ×2 IMPLANT
BNDG GAUZE DERMACEA FLUFF 4 (GAUZE/BANDAGES/DRESSINGS) ×2 IMPLANT
CORD BIPOLAR FORCEPS 12FT (ELECTRODE) IMPLANT
COVER BACK TABLE 60X90IN (DRAPES) ×2 IMPLANT
COVER MAYO STAND STRL (DRAPES) ×2 IMPLANT
CUFF TOURN SGL QUICK 18X4 (TOURNIQUET CUFF) IMPLANT
DRAPE EXTREMITY T 121X128X90 (DISPOSABLE) ×2 IMPLANT
DRAPE IMP U-DRAPE 54X76 (DRAPES) IMPLANT
DRAPE SURG 17X23 STRL (DRAPES) ×2 IMPLANT
GAUZE 4X4 16PLY ~~LOC~~+RFID DBL (SPONGE) IMPLANT
GAUZE SPONGE 4X4 12PLY STRL LF (GAUZE/BANDAGES/DRESSINGS) ×2 IMPLANT
GAUZE XEROFORM 1X8 LF (GAUZE/BANDAGES/DRESSINGS) ×2 IMPLANT
GLOVE BIO SURGEON STRL SZ 6.5 (GLOVE) ×2 IMPLANT
GLOVE BIO SURGEON STRL SZ8 (GLOVE) ×2 IMPLANT
GLOVE BIOGEL PI IND STRL 6.5 (GLOVE) ×2 IMPLANT
GLOVE BIOGEL PI IND STRL 8.5 (GLOVE) ×2 IMPLANT
GOWN STRL REUS W/ TWL LRG LVL3 (GOWN DISPOSABLE) ×2 IMPLANT
GOWN STRL REUS W/ TWL XL LVL3 (GOWN DISPOSABLE) ×2 IMPLANT
KNIFE CARPAL TUNNEL (BLADE) IMPLANT
LOOP VASCLR MAXI BLUE 18IN ST (MISCELLANEOUS) IMPLANT
NDL HYPO 25X1 1.5 SAFETY (NEEDLE) ×4 IMPLANT
NDL SAFETY ECLIPSE 18X1.5 (NEEDLE) ×4 IMPLANT
NEEDLE HYPO 25X1 1.5 SAFETY (NEEDLE) ×4
NS IRRIG 1000ML POUR BTL (IV SOLUTION) ×2 IMPLANT
PACK BASIN DAY SURGERY FS (CUSTOM PROCEDURE TRAY) ×2 IMPLANT
PAD ALCOHOL SWAB (MISCELLANEOUS) ×8 IMPLANT
PAD CAST 3X4 CTTN HI CHSV (CAST SUPPLIES) ×2 IMPLANT
PADDING CAST ABS COTTON 3X4 (CAST SUPPLIES) IMPLANT
PADDING CAST ABS COTTON 4X4 ST (CAST SUPPLIES) ×2 IMPLANT
SLING ARM FOAM STRAP LRG (SOFTGOODS) IMPLANT
STOCKINETTE 4X48 STRL (DRAPES) ×2 IMPLANT
STOCKINETTE SYNTHETIC 3 UNSTER (CAST SUPPLIES) IMPLANT
SUT MNCRL AB 4-0 PS2 18 (SUTURE) IMPLANT
SUT PROLENE 4 0 PS 2 18 (SUTURE) ×2 IMPLANT
SYR BULB EAR ULCER 3OZ GRN STR (SYRINGE) ×2 IMPLANT
SYR CONTROL 10ML LL (SYRINGE) ×4 IMPLANT
TIE VASCULAR MAXI BLUE 18IN ST (MISCELLANEOUS)
TOWEL GREEN STERILE FF (TOWEL DISPOSABLE) ×2 IMPLANT
TRAY DSU PREP LF (CUSTOM PROCEDURE TRAY) ×2 IMPLANT
UNDERPAD 30X36 HEAVY ABSORB (UNDERPADS AND DIAPERS) ×2 IMPLANT
VASCULAR TIE MAXI BLUE 18IN ST (MISCELLANEOUS)

## 2023-08-16 NOTE — Anesthesia Preprocedure Evaluation (Addendum)
Anesthesia Evaluation  Patient identified by MRN, date of birth, ID band Patient awake    Reviewed: Allergy & Precautions, NPO status , Patient's Chart, lab work & pertinent test results  History of Anesthesia Complications (+) PONV and history of anesthetic complications (cardiac arrest after receiving fentanyl)  Airway Mallampati: III  TM Distance: >3 FB Neck ROM: Full    Dental  (+) Dental Advisory Given   Pulmonary neg pulmonary ROS, neg shortness of breath, neg sleep apnea, neg COPD, neg recent URI   Pulmonary exam normal breath sounds clear to auscultation       Cardiovascular (-) hypertension(-) angina (-) Past MI, (-) Cardiac Stents and (-) CABG negative cardio ROS (-) dysrhythmias  Rhythm:Regular Rate:Normal     Neuro/Psych neg Seizures negative neurological ROS     GI/Hepatic Neg liver ROS, hiatal hernia,GERD  ,,Schatzki's ring   Endo/Other  negative endocrine ROS    Renal/GU negative Renal ROS     Musculoskeletal  (+) Arthritis , Osteoarthritis,    Abdominal   Peds  Hematology negative hematology ROS (+)   Anesthesia Other Findings   Reproductive/Obstetrics                              Anesthesia Physical Anesthesia Plan  ASA: 2  Anesthesia Plan: MAC and Regional   Post-op Pain Management: Regional block* and Tylenol PO (pre-op)*   Induction: Intravenous  PONV Risk Score and Plan: 2 and Ondansetron, Dexamethasone and Treatment may vary due to age or medical condition  Airway Management Planned: Natural Airway and Simple Face Mask  Additional Equipment:   Intra-op Plan:   Post-operative Plan:   Informed Consent: I have reviewed the patients History and Physical, chart, labs and discussed the procedure including the risks, benefits and alternatives for the proposed anesthesia with the patient or authorized representative who has indicated his/her understanding and  acceptance.     Dental advisory given  Plan Discussed with: CRNA and Anesthesiologist  Anesthesia Plan Comments: (Discussed with patient risks of MAC including, but not limited to, minor pain or discomfort, hearing people in the room, and possible need for backup general anesthesia. Risks for general anesthesia also discussed including, but not limited to, sore throat, hoarse voice, chipped/damaged teeth, injury to vocal cords, nausea and vomiting, allergic reactions, lung infection, heart attack, stroke, and death. All questions answered.  Discussed potential risks of nerve blocks including, but not limited to, infection, bleeding, nerve damage, seizures, pneumothorax, respiratory depression, and potential failure of the block. Alternatives to nerve blocks discussed. All questions answered. )         Anesthesia Quick Evaluation

## 2023-08-16 NOTE — Anesthesia Postprocedure Evaluation (Signed)
Anesthesia Post Note  Patient: KOLBEE BOGUSZ  Procedure(s) Performed: LEFT ELBOW NERVE TUNNEL RELEASE (Left: Arm Lower) CARPAL TUNNEL RELEASE (Left: Hand)     Patient location during evaluation: PACU Anesthesia Type: Regional and MAC Level of consciousness: awake and alert and oriented Pain management: pain level controlled Vital Signs Assessment: post-procedure vital signs reviewed and stable Respiratory status: spontaneous breathing, nonlabored ventilation and respiratory function stable Cardiovascular status: stable and blood pressure returned to baseline Postop Assessment: no apparent nausea or vomiting Anesthetic complications: no   No notable events documented.  Last Vitals:  Vitals:   08/16/23 1539 08/16/23 1545  BP: 118/65 112/60  Pulse: 66 61  Resp: 14 18  Temp: (!) 36.1 C   SpO2: 98% 97%    Last Pain:  Vitals:   08/16/23 1539  TempSrc:   PainSc: Asleep                 Denessa Cavan A.

## 2023-08-16 NOTE — Transfer of Care (Signed)
Immediate Anesthesia Transfer of Care Note  Patient: Antonio Daniel  Procedure(s) Performed: LEFT ELBOW NERVE TUNNEL RELEASE (Left: Arm Lower) CARPAL TUNNEL RELEASE (Left: Hand)  Patient Location: PACU  Anesthesia Type:MAC combined with regional for post-op pain  Level of Consciousness: sedated  Airway & Oxygen Therapy: Patient Spontanous Breathing and Patient connected to face mask oxygen  Post-op Assessment: Report given to RN and Post -op Vital signs reviewed and stable  Post vital signs: Reviewed and stable  Last Vitals:  Vitals Value Taken Time  BP 118/65 08/16/23 1539  Temp 36.1 C 08/16/23 1539  Pulse 63 08/16/23 1541  Resp 19 08/16/23 1541  SpO2 98 % 08/16/23 1541  Vitals shown include unfiled device data.  Last Pain:  Vitals:   08/16/23 1235  TempSrc: Oral  PainSc: 8       Patients Stated Pain Goal: 5 (08/16/23 1235)  Complications: No notable events documented.
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
## 2023-08-16 NOTE — Progress Notes (Signed)
Assisted Dr. Ardeen Jourdain with left, supraclavicular, ultrasound guided block. Side rails up, monitors on throughout procedure. See vital signs in flow sheet. Tolerated Procedure well.

## 2023-08-16 NOTE — Discharge Instructions (Addendum)
KEEP BANDAGE CLEAN AND DRY CALL OFFICE FOR F/U APPT [phone] in 15 days Rx sent to pharmacy KEEP HAND ELEVATED ABOVE HEART OK TO APPLY ICE TO OPERATIVE AREA CONTACT OFFICE IF ANY WORSENING PAIN OR CONCERNS.    No Tylenol until 6:30   Post Anesthesia Home Care Instructions  Activity: Get plenty of rest for the remainder of the day. A responsible individual must stay with you for 24 hours following the procedure.  For the next 24 hours, DO NOT: -Drive a car -Advertising copywriter -Drink alcoholic beverages -Take any medication unless instructed by your physician -Make any legal decisions or sign important papers.  Meals: Start with liquid foods such as gelatin or soup. Progress to regular foods as tolerated. Avoid greasy, spicy, heavy foods. If nausea and/or vomiting occur, drink only clear liquids until the nausea and/or vomiting subsides. Call your physician if vomiting continues.  Special Instructions/Symptoms: Your throat may feel dry or sore from the anesthesia or the breathing tube placed in your throat during surgery. If this causes discomfort, gargle with warm salt water. The discomfort should disappear within 24 hours.  If you had a scopolamine patch placed behind your ear for the management of post- operative nausea and/or vomiting:  1. The medication in the patch is effective for 72 hours, after which it should be removed.  Wrap patch in a tissue and discard in the trash. Wash hands thoroughly with soap and water. 2. You may remove the patch earlier than 72 hours if you experience unpleasant side effects which may include dry mouth, dizziness or visual disturbances. 3. Avoid touching the patch. Wash your hands with soap and water after contact with the patch.

## 2023-08-16 NOTE — Anesthesia Procedure Notes (Signed)
Anesthesia Regional Block: Supraclavicular block   Pre-Anesthetic Checklist: , timeout performed,  Correct Patient, Correct Site, Correct Laterality,  Correct Procedure, Correct Position, site marked,  Risks and benefits discussed,  Surgical consent,  Pre-op evaluation,  At surgeon's request and post-op pain management  Laterality: Left  Prep: chloraprep       Needles:  Injection technique: Single-shot  Needle Type: Echogenic Stimulator Needle     Needle Length: 9cm  Needle Gauge: 21     Additional Needles:   Procedures:,,,, ultrasound used (permanent image in chart),,    Narrative:  Start time: 08/16/2023 1:05 PM End time: 08/16/2023 1:09 PM Injection made incrementally with aspirations every 5 mL.  Performed by: Personally  Anesthesiologist: Linton Rump, MD  Additional Notes: Discussed risks and benefits of nerve block including, but not limited to, prolonged and/or permanent nerve injury involving sensory and/or motor function. Monitors were applied and a time-out was performed. The nerve and associated structures were visualized under ultrasound guidance. After negative aspiration, local anesthetic was slowly injected around the nerve. There was no evidence of high pressure during the procedure. There were no paresthesias. VSS remained stable and the patient tolerated the procedure well.

## 2023-08-16 NOTE — Op Note (Signed)
PREOPERATIVE DIAGNOSIS:LEFT HAND CARPAL TUNNEL SYNDROME LEFT ELBOW CUBITAL TUNNEL SYNDROME  POSTOPERATIVE DIAGNOSIS:SAME  ATTENDING SURGEON:DR. Emilo Gras WHO WAS SCRUBBED AND PRESENT FOR ENTIRE PROCEDURE  ASSISTANT SURGEON:NONE  ANESTHESIA:REGIONAL WITH IV SEDATION  OPERATIVE PROCEDURE: LEFT HAND CARPAL TUNNEL RELEASE LEFT ELBOW ULNAR NERVE RELEASE IN SITU  IMPLANTS:NONE  ZOX:[id number]  RADIOGRAPHIC INTERPRETATION:NONE  SURGICAL INDICATIONS: Patient is a right-hand-dominant gentleman with the persistent numbness in the left hand arm.  Patient elected undergo the above procedure.  Risk benefits alternatives discussed in detail with the patient and signed informed consent was obtained.  The risks include but not limited to bleeding infection damage nearby nerves arteries or tendons loss of motion of the wrist and digits incomplete relief of symptoms and need for further surgical invention.  SURGICAL TECHNIQUE:Patient was properly identified the preoperative holding area marked the permanent marker made on the left elbow and hand indicate correct operative site.  Patient brought back operating placed supine using table where the regional anesthetic was administered.  Patient tolerates well.  A well-padded tourniquet placed on the left brachium seal with the appropriate drape.  The left upper extremity was then prepped and draped in normal sterile fashion.  A timeout was called the correct site was identified and the procedure then begun.  Attention was then turned to the left hand.  A several centimeter incision was then made directly in the mid palm.  The tourniquet insufflated to 250 mmHg.  Dissection carried down through the skin and subcutaneous tissue.  Preoperative antibiotics were given prior to skin incision.  The palmar fascia was incised longitudinally.  Exposure of the transverse carpal ligament was then carried out and under direct visualization the distal one half the transverse  carpal ligament was then released.  Further exposure was then carried out proximally where the remaining portion the transverse carpal ligament as well as portion antebrachial fascia was then released.  Patient tolerated this well.  The wound was then thoroughly irrigated.  Copious wound irrigation done.  Contents of the carpal canal were then inspected and no other abnormalities were noted.  The skin was then closed using horizontal mattress Prolene sutures.  Attention was then turned to the left elbow. A curvilinear incision was made over the posteromedial aspect of the elbow.  Dissection was carried down through the skin and subcutaneous tissue.  The distal branches of the medial antebrachial cutaneous nerve were then carefully protected.  Deep dissection was carried down to the ulnar nerve where the nerve was then identified proximally.  The nerve was then released proximally 10 to 15 cm proximal to the medial epicondyle and a portion of the intermuscular septum was then released.  Portions of this were dissected.  Further dissection was then carried down through the cubital tunnel through Osborne's ligament and through the FCU fascia.  Going through the two heads of the FCU, the nerve was then traced distally protecting the proximal branch of the ulnar nerve to the FCU.  There was not any subluxation of the nerve anteriorly.  A portion of the medial intermuscular septum was resected proximally.  The nerve was released proximally and distally with good decompression.  The subcutaneous tissues were then closed with Monocryl.  The skin was then closed with running 4-0 subcuticular Prolene.  An Adaptic dressing and Steri-Strips were applied.  A sterile compressive bandage was then applied.  The patient was then placed in a long-arm splint taken to the recovery room in good condition.   POST-PROCEDURE PLAN:  The  patient will be discharged to home and see me back in the office in approximately 2 weeks for wound  check, suture removal, and begin a postoperative in situ ulnar nerve release protocol

## 2023-08-17 ENCOUNTER — Encounter (HOSPITAL_BASED_OUTPATIENT_CLINIC_OR_DEPARTMENT_OTHER): Payer: Self-pay | Admitting: Orthopedic Surgery

## 2023-08-29 ENCOUNTER — Encounter (HOSPITAL_BASED_OUTPATIENT_CLINIC_OR_DEPARTMENT_OTHER): Payer: Self-pay | Admitting: Orthopedic Surgery

## 2023-08-31 DIAGNOSIS — M25622 Stiffness of left elbow, not elsewhere classified: Secondary | ICD-10-CM | POA: Diagnosis not present

## 2023-09-20 DIAGNOSIS — M25622 Stiffness of left elbow, not elsewhere classified: Secondary | ICD-10-CM | POA: Diagnosis not present

## 2024-01-31 ENCOUNTER — Other Ambulatory Visit: Payer: Self-pay | Admitting: Gastroenterology

## 2024-02-05 DIAGNOSIS — Z9229 Personal history of other drug therapy: Secondary | ICD-10-CM | POA: Diagnosis not present

## 2024-02-05 DIAGNOSIS — K219 Gastro-esophageal reflux disease without esophagitis: Secondary | ICD-10-CM | POA: Diagnosis not present

## 2024-02-05 DIAGNOSIS — L4050 Arthropathic psoriasis, unspecified: Secondary | ICD-10-CM | POA: Diagnosis not present

## 2024-02-05 DIAGNOSIS — G9332 Myalgic encephalomyelitis/chronic fatigue syndrome: Secondary | ICD-10-CM | POA: Diagnosis not present

## 2024-02-05 DIAGNOSIS — Z0001 Encounter for general adult medical examination with abnormal findings: Secondary | ICD-10-CM | POA: Diagnosis not present

## 2024-02-05 DIAGNOSIS — M2550 Pain in unspecified joint: Secondary | ICD-10-CM | POA: Diagnosis not present

## 2024-02-05 DIAGNOSIS — Z6825 Body mass index (BMI) 25.0-25.9, adult: Secondary | ICD-10-CM | POA: Diagnosis not present

## 2024-02-05 DIAGNOSIS — Z125 Encounter for screening for malignant neoplasm of prostate: Secondary | ICD-10-CM | POA: Diagnosis not present

## 2024-02-05 DIAGNOSIS — M1991 Primary osteoarthritis, unspecified site: Secondary | ICD-10-CM | POA: Diagnosis not present

## 2024-02-05 DIAGNOSIS — Z1331 Encounter for screening for depression: Secondary | ICD-10-CM | POA: Diagnosis not present

## 2024-04-25 DIAGNOSIS — K219 Gastro-esophageal reflux disease without esophagitis: Secondary | ICD-10-CM | POA: Diagnosis not present

## 2024-04-25 DIAGNOSIS — Z299 Encounter for prophylactic measures, unspecified: Secondary | ICD-10-CM | POA: Diagnosis not present

## 2024-04-25 DIAGNOSIS — R52 Pain, unspecified: Secondary | ICD-10-CM | POA: Diagnosis not present

## 2024-04-25 DIAGNOSIS — M1990 Unspecified osteoarthritis, unspecified site: Secondary | ICD-10-CM | POA: Diagnosis not present

## 2024-04-25 DIAGNOSIS — I10 Essential (primary) hypertension: Secondary | ICD-10-CM | POA: Diagnosis not present

## 2024-05-14 ENCOUNTER — Observation Stay (HOSPITAL_COMMUNITY)
Admission: EM | Admit: 2024-05-14 | Discharge: 2024-05-15 | Disposition: A | Discharge status: Home / Self Care | Source: Ambulatory Visit | Attending: Surgery | Admitting: Surgery

## 2024-05-14 ENCOUNTER — Emergency Department (HOSPITAL_COMMUNITY)

## 2024-05-14 ENCOUNTER — Other Ambulatory Visit: Payer: Self-pay

## 2024-05-14 ENCOUNTER — Encounter: Payer: Self-pay | Admitting: Emergency Medicine

## 2024-05-14 ENCOUNTER — Encounter (HOSPITAL_COMMUNITY): Payer: Self-pay

## 2024-05-14 ENCOUNTER — Encounter (HOSPITAL_COMMUNITY)
Admission: EM | Disposition: A | Discharge status: Home / Self Care | Payer: Self-pay | Source: Ambulatory Visit | Attending: Emergency Medicine

## 2024-05-14 ENCOUNTER — Ambulatory Visit: Admission: EM | Admit: 2024-05-14 | Discharge: 2024-05-14 | Disposition: A | Discharge status: Home / Self Care

## 2024-05-14 DIAGNOSIS — K3589 Other acute appendicitis without perforation or gangrene: Principal | ICD-10-CM | POA: Insufficient documentation

## 2024-05-14 DIAGNOSIS — Z96653 Presence of artificial knee joint, bilateral: Secondary | ICD-10-CM | POA: Insufficient documentation

## 2024-05-14 DIAGNOSIS — K353 Acute appendicitis with localized peritonitis, without perforation or gangrene: Secondary | ICD-10-CM | POA: Diagnosis not present

## 2024-05-14 DIAGNOSIS — R1031 Right lower quadrant pain: Secondary | ICD-10-CM | POA: Diagnosis not present

## 2024-05-14 DIAGNOSIS — K3580 Unspecified acute appendicitis: Secondary | ICD-10-CM | POA: Diagnosis present

## 2024-05-14 DIAGNOSIS — I700 Atherosclerosis of aorta: Secondary | ICD-10-CM | POA: Diagnosis not present

## 2024-05-14 LAB — COMPREHENSIVE METABOLIC PANEL WITH GFR
ALT: 17 U/L (ref 0–44)
AST: 21 U/L (ref 15–41)
Albumin: 4.5 g/dL (ref 3.5–5.0)
Alkaline Phosphatase: 86 U/L (ref 38–126)
Anion gap: 13 (ref 5–15)
BUN: 15 mg/dL (ref 8–23)
CO2: 24 mmol/L (ref 22–32)
Calcium: 9.4 mg/dL (ref 8.9–10.3)
Chloride: 100 mmol/L (ref 98–111)
Creatinine, Ser: 0.86 mg/dL (ref 0.61–1.24)
GFR, Estimated: >60 mL/min (ref >60)
Glucose, Bld: 119 mg/dL — ABNORMAL HIGH (ref 70–99)
Potassium: 4.5 mmol/L (ref 3.5–5.1)
Sodium: 138 mmol/L (ref 135–145)
Total Bilirubin: 1.6 mg/dL — ABNORMAL HIGH (ref 0.0–1.2)
Total Protein: 7.7 g/dL (ref 6.5–8.1)

## 2024-05-14 LAB — CBC WITH DIFFERENTIAL/PLATELET
Abs Immature Granulocytes: 0.11 K/uL — ABNORMAL HIGH (ref 0.00–0.07)
Basophils Absolute: 0.1 K/uL (ref 0.0–0.1)
Basophils Relative: 0 %
Eosinophils Absolute: 0 K/uL (ref 0.0–0.5)
Eosinophils Relative: 0 %
HCT: 48.5 % (ref 39.0–52.0)
Hemoglobin: 16.3 g/dL (ref 13.0–17.0)
Immature Granulocytes: 1 %
Lymphocytes Relative: 6 %
Lymphs Abs: 1.2 K/uL (ref 0.7–4.0)
MCH: 30.3 pg (ref 26.0–34.0)
MCHC: 33.6 g/dL (ref 30.0–36.0)
MCV: 90.1 fL (ref 80.0–100.0)
Monocytes Absolute: 2 K/uL — ABNORMAL HIGH (ref 0.1–1.0)
Monocytes Relative: 9 %
Neutro Abs: 17.9 K/uL — ABNORMAL HIGH (ref 1.7–7.7)
Neutrophils Relative %: 84 %
Platelets: 250 K/uL (ref 150–400)
RBC: 5.38 MIL/uL (ref 4.22–5.81)
RDW: 14 % (ref 11.5–15.5)
WBC: 21.3 K/uL — ABNORMAL HIGH (ref 4.0–10.5)
nRBC: 0 % (ref 0.0–0.2)

## 2024-05-14 LAB — URINALYSIS, ROUTINE W REFLEX MICROSCOPIC
Bacteria, UA: NONE SEEN
Bilirubin Urine: NEGATIVE
Glucose, UA: NEGATIVE mg/dL
Ketones, ur: NEGATIVE mg/dL
Leukocytes,Ua: NEGATIVE
Nitrite: NEGATIVE
Protein, ur: NEGATIVE mg/dL
Specific Gravity, Urine: 1.02 (ref 1.005–1.030)
pH: 7 (ref 5.0–8.0)

## 2024-05-14 LAB — LIPASE, BLOOD: Lipase: 13 U/L (ref 11–51)

## 2024-05-14 SURGERY — APPENDECTOMY, ROBOT-ASSISTED, LAPAROSCOPIC
Anesthesia: General

## 2024-05-14 MED ORDER — POLYETHYLENE GLYCOL 3350 17 G PO PACK: 17.0000 g | PACK | Freq: Every day | ORAL | Status: DC

## 2024-05-14 MED ORDER — TRAMADOL HCL 50 MG PO TABS: 25.0000 mg | ORAL_TABLET | ORAL | Status: DC | PRN

## 2024-05-14 MED ORDER — IOHEXOL 300 MG/ML  SOLN
100.0000 mL | Freq: Once | INTRAMUSCULAR | Status: AC | PRN
  Administered 2024-05-14: 100 mL via INTRAVENOUS

## 2024-05-14 MED ORDER — METRONIDAZOLE 500 MG/100ML IV SOLN
500.0000 mg | Freq: Two times a day (BID) | INTRAVENOUS | Status: DC
  Administered 2024-05-14: 500 mg via INTRAVENOUS
  Filled 2024-05-14 (×2): qty 100

## 2024-05-14 MED ORDER — ONDANSETRON HCL 4 MG/2ML IJ SOLN: 4.0000 mg | Freq: Four times a day (QID) | INTRAMUSCULAR | Status: DC | PRN

## 2024-05-14 MED ORDER — PIPERACILLIN-TAZOBACTAM 3.375 G IVPB 30 MIN
3.3750 g | Freq: Once | INTRAVENOUS | Status: AC
  Administered 2024-05-14: 3.375 g via INTRAVENOUS
  Filled 2024-05-14: qty 50

## 2024-05-14 MED ORDER — ONDANSETRON 4 MG PO TBDP: 4.0000 mg | ORAL_TABLET | Freq: Four times a day (QID) | ORAL | Status: DC | PRN

## 2024-05-14 MED ORDER — ACETAMINOPHEN 500 MG PO TABS
1000.0000 mg | ORAL_TABLET | Freq: Four times a day (QID) | ORAL | Status: DC
Start: 2024-05-14 — End: 2024-05-15
  Administered 2024-05-14: 1000 mg via ORAL
  Filled 2024-05-14 (×2): qty 2

## 2024-05-14 MED ORDER — BUPIVACAINE HCL (PF) 0.5 % IJ SOLN
INTRAMUSCULAR | Status: AC
  Filled 2024-05-14: qty 30

## 2024-05-14 MED ORDER — IBUPROFEN 400 MG PO TABS: 600.0000 mg | ORAL_TABLET | Freq: Four times a day (QID) | ORAL | Status: DC

## 2024-05-14 MED ORDER — METHOCARBAMOL 500 MG PO TABS: 750.0000 mg | ORAL_TABLET | Freq: Four times a day (QID) | ORAL | Status: DC

## 2024-05-14 MED ORDER — SODIUM CHLORIDE 0.9 % IV SOLN
2.0000 g | INTRAVENOUS | Status: DC
  Administered 2024-05-14: 2 g via INTRAVENOUS
  Filled 2024-05-14 (×2): qty 20

## 2024-05-14 MED ORDER — KETOROLAC TROMETHAMINE 15 MG/ML IJ SOLN
15.0000 mg | Freq: Once | INTRAMUSCULAR | Status: AC
  Administered 2024-05-14: 15 mg via INTRAVENOUS
  Filled 2024-05-14: qty 1

## 2024-05-14 MED ORDER — HYDRALAZINE HCL 20 MG/ML IJ SOLN: 10.0000 mg | INTRAMUSCULAR | Status: DC | PRN

## 2024-05-14 MED ORDER — ENOXAPARIN SODIUM 40 MG/0.4ML IJ SOSY
40.0000 mg | PREFILLED_SYRINGE | INTRAMUSCULAR | Status: DC
  Administered 2024-05-14: 40 mg via SUBCUTANEOUS
  Filled 2024-05-14: qty 0.4

## 2024-05-14 MED ORDER — METOPROLOL TARTRATE 5 MG/5ML IV SOLN: 5.0000 mg | Freq: Four times a day (QID) | INTRAVENOUS | Status: DC | PRN

## 2024-05-14 NOTE — Discharge Instructions (Signed)
 Please go to the ER for further evaluation and management of the abdominal pain you are having

## 2024-05-14 NOTE — ED Notes (Signed)
 Patient is being discharged from the Urgent Care and sent to the Emergency Department via POV . Per provider, patient is in need of higher level of care due to severe RLQ pain. Patient is aware and verbalizes understanding of plan of care.  Vitals:   05/14/24 1053  BP: 130/76  Pulse: 83  Resp: 20  Temp: 99.1 F (37.3 C)  SpO2: 94%

## 2024-05-14 NOTE — ED Provider Notes (Signed)
 [facility] AT [facility] Provider Note   CSN: [id number] Arrival date & time: 05/14/24  1124     Patient presents with: Abdominal Pain   Antonio Daniel is a 73 y.o. male.   Patient is a 73 year old male who presents emergency department the chief complaint of right lower quadrant abdominal pain which has been ongoing for approximate the past 2 days.  He has had no associated nausea, vomiting, diarrhea.  He denies any dysuria or hematuria.  He notes he does have a history of previous hernia repair but denies any abdominal surgeries.  He denies any associated dizziness, lightheadedness, syncope.  He has had no chest pain or shortness of breath.   Abdominal Pain      Prior to Admission medications   Medication Sig Start Date End Date Taking? Authorizing Provider  ibuprofen  (ADVIL ) 800 MG tablet Take 800 mg by mouth every 8 (eight) hours as needed.    [provider]  omeprazole  (PRILOSEC) 20 MG capsule Take 1 capsule (20 mg total) by mouth daily as needed (acid reflux). Please schedule a yearly follow up for further refills. Thank you 01/31/24   Armbruster, Elspeth SQUIBB, MD    Allergies: Fentanyl  and Oxycontin  [oxycodone ]    Review of Systems  Gastrointestinal:  Positive for abdominal pain.  All other systems reviewed and are negative.   Updated Vital Signs BP 128/82   Pulse 65   Temp 98.6 F (37 C) (Oral)   Resp 20   Ht 5' 11 (1.803 m)   Wt 78.3 kg   SpO2 95%   BMI 24.08 kg/m   Physical Exam Vitals and nursing note reviewed.  Constitutional:      General: He is not in acute distress.    Appearance: Normal appearance. He is not ill-appearing.  HENT:     Head: Normocephalic and atraumatic.     Nose: Nose normal.     Mouth/Throat:     Mouth: Mucous membranes are moist.  Eyes:     Extraocular Movements: Extraocular movements intact.     Conjunctiva/sclera: Conjunctivae normal.     Pupils: Pupils are equal, round, and  reactive to light.  Cardiovascular:     Rate and Rhythm: Normal rate and regular rhythm.     Pulses: Normal pulses.     Heart sounds: Normal heart sounds. No murmur heard.    No gallop.  Pulmonary:     Effort: Pulmonary effort is normal. No respiratory distress.     Breath sounds: Normal breath sounds. No stridor. No wheezing, rhonchi or rales.  Abdominal:     General: Abdomen is flat. Bowel sounds are normal.     Palpations: Abdomen is soft.     Tenderness: There is abdominal tenderness in the right lower quadrant. Positive signs include McBurney's sign. Negative signs include Murphy's sign.     Hernia: No hernia is present.  Musculoskeletal:        General: Normal range of motion.     Cervical back: Normal range of motion and neck supple.  Skin:    General: Skin is warm and dry.  Neurological:     General: No focal deficit present.     Mental Status: He is alert and oriented to person, place, and time. Mental status is at baseline.  Psychiatric:        Mood and Affect: Mood normal.        Behavior: Behavior normal.        Thought  Content: Thought content normal.        Judgment: Judgment normal.     (all labs ordered are listed, but only abnormal results are displayed) Labs Reviewed  CBC WITH DIFFERENTIAL/PLATELET - Abnormal; Notable for the following components:      Result Value   WBC 21.3 (*)    Neutro Abs 17.9 (*)    Monocytes Absolute 2.0 (*)    Abs Immature Granulocytes 0.11 (*)    All other components within normal limits  COMPREHENSIVE METABOLIC PANEL WITH GFR - Abnormal; Notable for the following components:   Glucose, Bld 119 (*)    Total Bilirubin 1.6 (*)    All other components within normal limits  URINALYSIS, ROUTINE W REFLEX MICROSCOPIC - Abnormal; Notable for the following components:   APPearance HAZY (*)    Hgb urine dipstick SMALL (*)    All other components within normal limits  LIPASE, BLOOD    EKG: None  Radiology: CT ABDOMEN PELVIS W  CONTRAST Result Date: 05/14/2024 CLINICAL DATA:  Right lower quadrant abdominal pain. EXAM: CT ABDOMEN AND PELVIS WITH CONTRAST TECHNIQUE: Multidetector CT imaging of the abdomen and pelvis was performed using the standard protocol following bolus administration of intravenous contrast. RADIATION DOSE REDUCTION: This exam was performed according to the departmental dose-optimization program which includes automated exposure control, adjustment of the mA and/or kV according to patient size and/or use of iterative reconstruction technique. CONTRAST:  OMNIPAQUE  IOHEXOL  300 MG/ML  SOLN COMPARISON:  CT and pelvis dated 06/24/2022. FINDINGS: Lower chest: Bibasilar linear atelectasis/scarring. No intra-abdominal free air or free fluid. Hepatobiliary: The liver is unremarkable. No biliary ductal dilatation. The gallbladder is unremarkable. Pancreas: Unremarkable. No pancreatic ductal dilatation or surrounding inflammatory changes. Spleen: Normal in size without focal abnormality. Adrenals/Urinary Tract: The adrenal glands unremarkable. There is no hydronephrosis on either side. There is symmetric enhancement and excretion of contrast by both kidneys. The visualized ureters and urinary bladder appear unremarkable. Stomach/Bowel: There is no bowel obstruction. Stones noted at the base and proximal appendix. The appendix is enlarged and inflamed measuring 12 mm in diameter. The appendix is located in the right lower quadrant inferior to the cecum. No abscess or perforation. Vascular/Lymphatic: Mild aortoiliac atherosclerotic disease. The IVC is unremarkable. No portal venous gas. There is no adenopathy. Reproductive: The prostate and seminal vesicles are grossly remarkable. Other: None Musculoskeletal: Osteopenia with degenerative changes of the spine. No acute osseous pathology. IMPRESSION: 1. Acute appendicitis. No abscess or perforation. 2.  Aortic Atherosclerosis (ICD10-I70.0).  The Electronically Signed   By:  Vanetta Chou M.D.   On: 05/14/2024 14:40     Procedures   Medications Ordered in the ED  piperacillin-tazobactam (ZOSYN) IVPB 3.375 g (3.375 g Intravenous New Bag/Given 05/14/24 1509)  iohexol  (OMNIPAQUE ) 300 MG/ML solution 100 mL (100 mLs Intravenous Contrast Given 05/14/24 1410)                                    Medical Decision Making Patient is doing very well at this time and does remain stable.  Did discuss with patient that CT scan findings are consistent with acute appendicitis.  Patient notes that he does not want to have surgery at our facility and is requesting transfer to [facility] for general surgery.  Did discuss patient case with Dr. Paola with general surgery at [facility] who has excepted for direct admission at this point.  He  has been given dose of Zosyn.  Patient notes that he would like his wife to transport him as she is a retired [employer].  Do suspect that this is reasonable at this point.  Patient has stable vital signs with no indication for sepsis.  Patient will go directly to [facility] for admission.  Amount and/or Complexity of Data Reviewed Labs: ordered. Radiology: ordered.  Risk Prescription drug management.        Final diagnoses:  Other acute appendicitis    ED Discharge Orders     None          Daralene Lonni JONETTA DEVONNA 05/14/24 1526    Melvenia Motto, MD 05/14/24 [phone]

## 2024-05-14 NOTE — ED Triage Notes (Signed)
 Pt arrived via POV from [facility] for further evaluation of RLQ abdominal pain that began yesterday. Pt reports concern for possible appendicitis.

## 2024-05-14 NOTE — ED Triage Notes (Signed)
 Pt reports RLQ pain, bloating since last night. Reports LBM this am. Denies any fever. Has taken pepto last night with no change in symptoms.

## 2024-05-14 NOTE — ED Notes (Signed)
 Attempt made x 2 to call report to [facility].  No answer from floor x 2.  Patient will be direct admit via POV.

## 2024-05-14 NOTE — ED Notes (Signed)
 Patient taken to CT at this time.

## 2024-05-14 NOTE — ED Notes (Signed)
 Patient will be going to [facility] via POV.  Wife will be transporting patient.  IV in R lower arm wrapped and secured for transport.  Pt and wife made aware to go directly to [facility] and to remain NPO.  Understanding voiced.
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
## 2024-05-15 ENCOUNTER — Other Ambulatory Visit (HOSPITAL_COMMUNITY): Payer: Self-pay

## 2024-05-15 ENCOUNTER — Observation Stay (HOSPITAL_COMMUNITY): Admitting: Anesthesiology

## 2024-05-15 ENCOUNTER — Encounter (HOSPITAL_COMMUNITY): Admission: EM | Disposition: A | Payer: Self-pay | Source: Ambulatory Visit | Attending: Emergency Medicine

## 2024-05-15 ENCOUNTER — Encounter (HOSPITAL_COMMUNITY): Payer: Self-pay

## 2024-05-15 ENCOUNTER — Encounter: Payer: Self-pay | Attending: Emergency Medicine

## 2024-05-15 DIAGNOSIS — K3580 Unspecified acute appendicitis: Secondary | ICD-10-CM | POA: Diagnosis not present

## 2024-05-15 DIAGNOSIS — K37 Unspecified appendicitis: Secondary | ICD-10-CM | POA: Diagnosis not present

## 2024-05-15 DIAGNOSIS — K353 Acute appendicitis with localized peritonitis, without perforation or gangrene: Secondary | ICD-10-CM | POA: Diagnosis not present

## 2024-05-15 DIAGNOSIS — K3589 Other acute appendicitis without perforation or gangrene: Secondary | ICD-10-CM | POA: Diagnosis not present

## 2024-05-15 HISTORY — PX: LAPAROSCOPIC APPENDECTOMY: SHX408

## 2024-05-15 LAB — CBC
HCT: 46.1 % (ref 39.0–52.0)
Hemoglobin: 14.3 g/dL (ref 13.0–17.0)
MCH: 28.4 pg (ref 26.0–34.0)
MCHC: 31 g/dL (ref 30.0–36.0)
MCV: 91.5 fL (ref 80.0–100.0)
Platelets: 213 K/uL (ref 150–400)
RBC: 5.04 MIL/uL (ref 4.22–5.81)
RDW: 14.1 % (ref 11.5–15.5)
WBC: 14.3 K/uL — ABNORMAL HIGH (ref 4.0–10.5)
nRBC: 0 % (ref 0.0–0.2)

## 2024-05-15 LAB — BASIC METABOLIC PANEL WITH GFR
Anion gap: 9 (ref 5–15)
BUN: 24 mg/dL — ABNORMAL HIGH (ref 8–23)
CO2: 26 mmol/L (ref 22–32)
Calcium: 9.1 mg/dL (ref 8.9–10.3)
Chloride: 102 mmol/L (ref 98–111)
Creatinine, Ser: 1.03 mg/dL (ref 0.61–1.24)
GFR, Estimated: >60 mL/min (ref >60)
Glucose, Bld: 99 mg/dL (ref 70–99)
Potassium: 4 mmol/L (ref 3.5–5.1)
Sodium: 137 mmol/L (ref 135–145)

## 2024-05-15 LAB — SURGICAL PCR SCREEN
MRSA, PCR: NEGATIVE
Staphylococcus aureus: NEGATIVE

## 2024-05-15 SURGERY — APPENDECTOMY, LAPAROSCOPIC
Anesthesia: General | Site: Abdomen

## 2024-05-15 SURGERY — APPENDECTOMY, LAPAROSCOPIC
Anesthesia: General

## 2024-05-15 MED ORDER — LACTATED RINGERS IV SOLN: INTRAVENOUS | Status: DC

## 2024-05-15 MED ORDER — CHLORHEXIDINE GLUCONATE 0.12 % MT SOLN
15.0000 mL | Freq: Once | OROMUCOSAL | Status: AC
  Administered 2024-05-15: 15 mL via OROMUCOSAL

## 2024-05-15 MED ORDER — ONDANSETRON 4 MG PO TBDP
4.0000 mg | ORAL_TABLET | Freq: Three times a day (TID) | ORAL | 0 refills | Status: AC | PRN
  Filled 2024-05-15: qty 15, 5d supply, fill #0

## 2024-05-15 MED ORDER — BUPIVACAINE-EPINEPHRINE (PF) 0.25% -1:200000 IJ SOLN
INTRAMUSCULAR | Status: AC
  Filled 2024-05-15: qty 30

## 2024-05-15 MED ORDER — SUGAMMADEX SODIUM 200 MG/2ML IV SOLN
INTRAVENOUS | Status: AC
  Filled 2024-05-15: qty 4

## 2024-05-15 MED ORDER — PHENYLEPHRINE 80 MCG/ML (10ML) SYRINGE FOR IV PUSH (FOR BLOOD PRESSURE SUPPORT)
PREFILLED_SYRINGE | INTRAVENOUS | Status: AC
  Filled 2024-05-15: qty 10

## 2024-05-15 MED ORDER — IBUPROFEN 600 MG PO TABS
600.0000 mg | ORAL_TABLET | Freq: Four times a day (QID) | ORAL | 1 refills | Status: AC
  Filled 2024-05-15: qty 120, 30d supply, fill #0

## 2024-05-15 MED ORDER — LIDOCAINE HCL (CARDIAC) PF 100 MG/5ML IV SOSY
PREFILLED_SYRINGE | INTRAVENOUS | Status: DC | PRN
  Administered 2024-05-15: 100 mg via INTRAVENOUS
  Administered 2024-05-15: 60 mg via INTRAVENOUS

## 2024-05-15 MED ORDER — MIDAZOLAM HCL 5 MG/5ML IJ SOLN
INTRAMUSCULAR | Status: DC | PRN
  Administered 2024-05-15: 2 mg via INTRAVENOUS

## 2024-05-15 MED ORDER — ONDANSETRON HCL 4 MG/2ML IJ SOLN: 4.0000 mg | Freq: Once | INTRAMUSCULAR | Status: DC | PRN

## 2024-05-15 MED ORDER — ONDANSETRON HCL 4 MG/2ML IJ SOLN
INTRAMUSCULAR | Status: AC
  Filled 2024-05-15: qty 2

## 2024-05-15 MED ORDER — ONDANSETRON HCL 4 MG/2ML IJ SOLN
INTRAMUSCULAR | Status: DC | PRN
  Administered 2024-05-15: 4 mg via INTRAVENOUS

## 2024-05-15 MED ORDER — PROPOFOL 10 MG/ML IV BOLUS
INTRAVENOUS | Status: AC
  Filled 2024-05-15: qty 20

## 2024-05-15 MED ORDER — BUPIVACAINE-EPINEPHRINE (PF) 0.25% -1:200000 IJ SOLN
INTRAMUSCULAR | Status: DC | PRN
  Administered 2024-05-15: 30 mL via PERINEURAL

## 2024-05-15 MED ORDER — AMISULPRIDE (ANTIEMETIC) 5 MG/2ML IV SOLN: 10.0000 mg | Freq: Once | INTRAVENOUS | Status: DC | PRN

## 2024-05-15 MED ORDER — FENTANYL CITRATE (PF) 100 MCG/2ML IJ SOLN
INTRAMUSCULAR | Status: AC
  Filled 2024-05-15: qty 2

## 2024-05-15 MED ORDER — METRONIDAZOLE 500 MG/100ML IV SOLN
INTRAVENOUS | Status: DC | PRN
  Administered 2024-05-15: 500 mg via INTRAVENOUS

## 2024-05-15 MED ORDER — ONDANSETRON HCL 4 MG/2ML IJ SOLN: 4.0000 mg | Freq: Four times a day (QID) | INTRAMUSCULAR | Status: DC | PRN

## 2024-05-15 MED ORDER — CHLORHEXIDINE GLUCONATE 0.12 % MT SOLN: 15.0000 mL | Freq: Once | OROMUCOSAL | Status: DC

## 2024-05-15 MED ORDER — ROCURONIUM BROMIDE 10 MG/ML (PF) SYRINGE
PREFILLED_SYRINGE | INTRAVENOUS | Status: AC
  Filled 2024-05-15: qty 10

## 2024-05-15 MED ORDER — OXYCODONE HCL 5 MG PO TABS
5.0000 mg | ORAL_TABLET | ORAL | 0 refills | Status: AC | PRN
  Filled 2024-05-15: qty 15, 3d supply, fill #0

## 2024-05-15 MED ORDER — METHOCARBAMOL 750 MG PO TABS
750.0000 mg | ORAL_TABLET | Freq: Four times a day (QID) | ORAL | 1 refills | Status: AC
Start: 2024-05-15 — End: ?
  Filled 2024-05-15: qty 120, 30d supply, fill #0

## 2024-05-15 MED ORDER — HYDROMORPHONE HCL 1 MG/ML IJ SOLN: 0.2500 mg | INTRAMUSCULAR | Status: DC | PRN

## 2024-05-15 MED ORDER — HYDROMORPHONE HCL 2 MG/ML IJ SOLN
INTRAMUSCULAR | Status: AC
  Filled 2024-05-15: qty 1

## 2024-05-15 MED ORDER — 0.9 % SODIUM CHLORIDE (POUR BTL) OPTIME
TOPICAL | Status: DC | PRN
  Administered 2024-05-15: 1000 mL

## 2024-05-15 MED ORDER — ACETAMINOPHEN 500 MG PO TABS
1000.0000 mg | ORAL_TABLET | Freq: Once | ORAL | Status: AC
  Administered 2024-05-15: 1000 mg via ORAL

## 2024-05-15 MED ORDER — HYDROMORPHONE HCL 1 MG/ML IJ SOLN
INTRAMUSCULAR | Status: DC | PRN
  Administered 2024-05-15: .2 mg via INTRAVENOUS
  Administered 2024-05-15: .4 mg via INTRAVENOUS

## 2024-05-15 MED ORDER — SUGAMMADEX SODIUM 200 MG/2ML IV SOLN
INTRAVENOUS | Status: DC | PRN
  Administered 2024-05-15: 320 mg via INTRAVENOUS

## 2024-05-15 MED ORDER — PROPOFOL 1000 MG/100ML IV EMUL
INTRAVENOUS | Status: AC
  Filled 2024-05-15: qty 100

## 2024-05-15 MED ORDER — LIDOCAINE HCL (PF) 2 % IJ SOLN
INTRAMUSCULAR | Status: AC
  Filled 2024-05-15: qty 5

## 2024-05-15 MED ORDER — KETOROLAC TROMETHAMINE 30 MG/ML IJ SOLN: 15.0000 mg | Freq: Once | INTRAMUSCULAR | Status: DC | PRN

## 2024-05-15 MED ORDER — DEXAMETHASONE SOD PHOSPHATE PF 10 MG/ML IJ SOLN
INTRAMUSCULAR | Status: DC | PRN
  Administered 2024-05-15: 10 mg via INTRAVENOUS

## 2024-05-15 MED ORDER — PHENYLEPHRINE HCL (PRESSORS) 10 MG/ML IV SOLN
INTRAVENOUS | Status: DC | PRN
  Administered 2024-05-15: 160 ug via INTRAVENOUS

## 2024-05-15 MED ORDER — MIDAZOLAM HCL 2 MG/2ML IJ SOLN
INTRAMUSCULAR | Status: AC
  Filled 2024-05-15: qty 2

## 2024-05-15 MED ORDER — POLYETHYLENE GLYCOL 3350 17 GM/SCOOP PO POWD
17.0000 g | Freq: Every day | ORAL | 1 refills | Status: AC
Start: 2024-05-15 — End: ?
  Filled 2024-05-15: qty 238, 14d supply, fill #0

## 2024-05-15 MED ORDER — ROCURONIUM BROMIDE 100 MG/10ML IV SOLN
INTRAVENOUS | Status: DC | PRN
  Administered 2024-05-15: 70 mg via INTRAVENOUS

## 2024-05-15 MED ORDER — HYDROCODONE-ACETAMINOPHEN 7.5-325 MG PO TABS: 1.0000 | ORAL_TABLET | Freq: Once | ORAL | Status: DC | PRN

## 2024-05-15 MED ORDER — STERILE WATER FOR IRRIGATION IR SOLN
Status: DC | PRN
Start: 2024-05-15 — End: 2024-05-15
  Administered 2024-05-15: 1000 mL

## 2024-05-15 MED ORDER — OXYCODONE HCL 5 MG PO TABS
5.0000 mg | ORAL_TABLET | ORAL | Status: DC | PRN
  Administered 2024-05-15: 5 mg via ORAL
  Filled 2024-05-15: qty 1

## 2024-05-15 MED ORDER — LACTATED RINGERS IV SOLN: INTRAVENOUS | Status: DC | PRN

## 2024-05-15 MED ORDER — PROPOFOL 10 MG/ML IV BOLUS
INTRAVENOUS | Status: DC | PRN
  Administered 2024-05-15: 200 mg via INTRAVENOUS

## 2024-05-15 MED ORDER — ORAL CARE MOUTH RINSE: 15.0000 mL | Freq: Once | OROMUCOSAL | Status: DC

## 2024-05-15 MED ORDER — ACETAMINOPHEN 500 MG PO TABS
1000.0000 mg | ORAL_TABLET | Freq: Four times a day (QID) | ORAL | 3 refills | Status: AC
  Filled 2024-05-15: qty 120, 15d supply, fill #0

## 2024-05-15 SURGICAL SUPPLY — 34 items
BAG COUNTER SPONGE SURGICOUNT (BAG) IMPLANT
CHLORAPREP W/TINT 26 (MISCELLANEOUS) ×1 IMPLANT
CLIP APPLIE ROT 10 11.4 M/L (STAPLE) IMPLANT
COVER SURGICAL LIGHT HANDLE (MISCELLANEOUS) ×1 IMPLANT
CUTTER ECHEON FLEX ENDO 45 340 (ENDOMECHANICALS) IMPLANT
DERMABOND ADVANCED .7 DNX12 (GAUZE/BANDAGES/DRESSINGS) ×1 IMPLANT
ELECT PENCIL ROCKER SW 15FT (MISCELLANEOUS) IMPLANT
ELECT REM PT RETURN 15FT ADLT (MISCELLANEOUS) ×1 IMPLANT
ENDOLOOP SUT PDS II 0 18 (SUTURE) IMPLANT
GLOVE BIO SURGEON STRL SZ 6.5 (GLOVE) ×1 IMPLANT
GLOVE BIOGEL PI IND STRL 6 (GLOVE) ×1 IMPLANT
GOWN STRL REUS W/ TWL LRG LVL3 (GOWN DISPOSABLE) ×1 IMPLANT
GOWN STRL REUS W/ TWL XL LVL3 (GOWN DISPOSABLE) IMPLANT
IRRIGATION SUCT STRKRFLW 2 WTP (MISCELLANEOUS) IMPLANT
KIT BASIN OR (CUSTOM PROCEDURE TRAY) ×1 IMPLANT
KIT TURNOVER KIT A (KITS) ×1 IMPLANT
NDL INSUFFLATION 14GA 120MM (NEEDLE) IMPLANT
NEEDLE INSUFFLATION 14GA 120MM (NEEDLE) IMPLANT
POUCH RETRIEVAL ECOSAC 10 (ENDOMECHANICALS) ×1 IMPLANT
RELOAD STAPLE 45 2.6 WHT THIN (STAPLE) IMPLANT
RELOAD STAPLE 45 3.6 BLU REG (STAPLE) IMPLANT
SET TUBE SMOKE EVAC HIGH FLOW (TUBING) ×1 IMPLANT
SHEARS HARMONIC 36 ACE (MISCELLANEOUS) IMPLANT
SLEEVE ADV FIXATION 5X100MM (TROCAR) ×1 IMPLANT
SPIKE FLUID TRANSFER (MISCELLANEOUS) ×1 IMPLANT
SUT MNCRL AB 4-0 PS2 18 (SUTURE) ×1 IMPLANT
SUT VIC AB 0 UR5 27 (SUTURE) IMPLANT
SUT VICRYL 0 UR6 27IN ABS (SUTURE) ×1 IMPLANT
TOWEL OR 17X26 10 PK STRL BLUE (TOWEL DISPOSABLE) ×1 IMPLANT
TRAY FOLEY MTR SLVR 14FR STAT (SET/KITS/TRAYS/PACK) IMPLANT
TRAY FOLEY MTR SLVR 16FR STAT (SET/KITS/TRAYS/PACK) IMPLANT
TRAY LAPAROSCOPIC (CUSTOM PROCEDURE TRAY) ×1 IMPLANT
TROCAR ADV FIXATION 5X100MM (TROCAR) ×1 IMPLANT
TROCAR BALLN 12MMX100 BLUNT (TROCAR) ×1 IMPLANT

## 2024-05-15 NOTE — Transfer of Care (Signed)
 Immediate Anesthesia Transfer of Care Note  Patient: Antonio Daniel  Procedure(s) Performed: APPENDECTOMY, LAPAROSCOPIC (Abdomen)  Patient Location: PACU  Anesthesia Type:General  Level of Consciousness: awake, alert , and oriented  Airway & Oxygen Therapy: Patient Spontanous Breathing and Patient connected to face mask oxygen  Post-op Assessment: Report given to RN and Post -op Vital signs reviewed and stable  Post vital signs: Reviewed and stable  Last Vitals:  Vitals Value Taken Time  BP 119/69 05/15/24 13:05  Temp    Pulse 68 05/15/24 13:08  Resp 15 05/15/24 13:08  SpO2 95 % 05/15/24 13:08  Vitals shown include unfiled device data.  Last Pain:  Vitals:   05/15/24 0826  TempSrc: Oral  PainSc: 4       Patients Stated Pain Goal: 2 (05/14/24 1807)  Complications: No notable events documented.

## 2024-05-15 NOTE — Plan of Care (Signed)
  Problem: Clinical Measurements: Goal: Diagnostic test results will improve Outcome: Progressing   Problem: Elimination: Goal: Will not experience complications related to bowel motility Outcome: Progressing   Problem: Pain Managment: Goal: General experience of comfort will improve and/or be controlled Outcome: Progressing

## 2024-05-15 NOTE — Op Note (Signed)
   Operative Note   Date: 05/15/2024  Procedure: laparoscopic appendectomy  Pre-op diagnosis: acute appendicitis Post-op diagnosis: Grade 1b appendicitis: moderate to severely inflamed appendix  Indication and clinical history: The patient is a 73 y.o. year old male with acute appendicitis     Surgeon: Dreama GEANNIE Hanger, MD  Anesthesiologist: Merla, DO Anesthesia: General  Findings:  Specimen: appendix EBL: <5cc Drains/Implants: none  Disposition: PACU - hemodynamically stable.  Description of procedure: The patient was positioned supine on the operating room table. Time-out was performed verifying correct patient, procedure, signature of informed consent, and administration of pre-operative antibiotics. General anesthetic induction and intubation were uneventful. Foley catheter insertion was not performed as patient voided immediately prior to the procedure . The abdomen was prepped and draped in the usual sterile fashion. An infra-umbilical incision was made using an open technique using zero vicryl stay sutures on either side of the fascia and a 10mm Hassan port inserted. After establishing pneumoperitoneum, which the patient tolerated well, the abdominal cavity was inspected and no injury of any intra-abdominal structures was identified. Two additional five millimeter ports were placed under direct visualization and using local anesthetic in the suprapubic and left lower quadrant regions. The patient was repositioned to Trendelenburg with the left side down. Further inspection of the right lower quadrant revealed grade 1b appendicitis. Adhesiolysis was performed for approximately 5 minutes during dissection, mobilization, and identification of the appendix. The appendix was dissected away from its mesoappendix and an endoscopic stapler used to divide the mesoappendix using a vascular load. A bowel load of the endoscopic stapler was used to staple across the appendix at its base. Both  staple lines were inspected and found to be intact and without bleeding. The appendix was placed in an endoscopic specimen retrieval bag, removed via the umbilical port site, and sent to pathology as a permanent specimen. The right lower quadrant was again inspected and hemostasis confirmed. The suprapubic and left lower quadrant ports were removed under direct visualization and hemostasis confirmed. The umbilical port was removed last after desufflating the abdomen and the fascia re-approximated using the stay sutures. Additional local anesthetic was administered at the umbilical incision site. The skin of all port sites was closed with 4-0 monocryl. Sterile dressings were applied. All sponge and instrument counts were correct at the conclusion of the procedure. The patient was awakened from anesthesia, extubated uneventfully, and transported to the PACU in good condition. There were no complications.   Upon entering the abdomen (organ space), I encountered infection of the appendix.  CASE DATA:  Type of patient?: LDOW CASE (Surgical Hospitalist [facility] Inpatient)  Status of Case? URGENT Add On  Infection Present At Time Of Surgery (PATOS)?  INFECTION of the appendix   Dreama GEANNIE Hanger, MD General and Trauma Surgery [facility]

## 2024-05-15 NOTE — Progress Notes (Signed)
 General Surgery Follow Up Note  Subjective:    Overnight Issues:   Objective:  Vital signs for last 24 hours: Temp:  [97.9 F (36.6 C)-99.8 F (37.7 C)] 97.9 F (36.6 C) (10/29 0826) Pulse Rate:  [52-82] 57 (10/29 0826) Resp:  [16-23] 16 (10/29 0826) BP: (106-138)/(61-82) 123/68 (10/29 0826) SpO2:  [91 %-99 %] 95 % (10/29 0826) Weight:  [78.3 kg] 78.3 kg (10/29 0826)  Hemodynamic parameters for last 24 hours:    Intake/Output from previous day: 10/28 0701 - 10/29 0700 In: 473.2 [P.O.:100; IV Piggyback:373.2] Out: 0   Intake/Output this shift: No intake/output data recorded.  Vent settings for last 24 hours:    Physical Exam:  Gen: comfortable, no distress Neuro: follows commands, alert, communicative HEENT: PERRL Neck: supple CV: RRR Pulm: unlabored breathing on RA Abd: soft, NT   GU: urine clear and yellow, +spontaneous void Extr: wwp, no edema  Results for orders placed or performed during the hospital encounter of 05/14/24 (from the past 24 hours)  CBC with Differential     Status: Abnormal   Collection Time: 05/14/24 11:54 AM  Result Value Ref Range   WBC 21.3 (H) 4.0 - 10.5 K/uL   RBC 5.38 4.22 - 5.81 MIL/uL   Hemoglobin 16.3 13.0 - 17.0 g/dL   HCT 51.4 60.9 - 47.9 %   MCV 90.1 80.0 - 100.0 fL   MCH 30.3 26.0 - 34.0 pg   MCHC 33.6 30.0 - 36.0 g/dL   RDW 85.9 88.4 - 84.4 %   Platelets 250 150 - 400 K/uL   nRBC 0.0 0.0 - 0.2 %   Neutrophils Relative % 84 %   Neutro Abs 17.9 (H) 1.7 - 7.7 K/uL   Lymphocytes Relative 6 %   Lymphs Abs 1.2 0.7 - 4.0 K/uL   Monocytes Relative 9 %   Monocytes Absolute 2.0 (H) 0.1 - 1.0 K/uL   Eosinophils Relative 0 %   Eosinophils Absolute 0.0 0.0 - 0.5 K/uL   Basophils Relative 0 %   Basophils Absolute 0.1 0.0 - 0.1 K/uL   Immature Granulocytes 1 %   Abs Immature Granulocytes 0.11 (H) 0.00 - 0.07 K/uL  Comprehensive metabolic panel     Status: Abnormal   Collection Time: 05/14/24 11:54 AM  Result Value Ref Range    Sodium 138 135 - 145 mmol/L   Potassium 4.5 3.5 - 5.1 mmol/L   Chloride 100 98 - 111 mmol/L   CO2 24 22 - 32 mmol/L   Glucose, Bld 119 (H) 70 - 99 mg/dL   BUN 15 8 - 23 mg/dL   Creatinine, Ser 9.13 0.61 - 1.24 mg/dL   Calcium 9.4 8.9 - 89.6 mg/dL   Total Protein 7.7 6.5 - 8.1 g/dL   Albumin 4.5 3.5 - 5.0 g/dL   AST 21 15 - 41 U/L   ALT 17 0 - 44 U/L   Alkaline Phosphatase 86 38 - 126 U/L   Total Bilirubin 1.6 (H) 0.0 - 1.2 mg/dL   GFR, Estimated >39 >39 mL/min   Anion gap 13 5 - 15  Lipase, blood     Status: None   Collection Time: 05/14/24 11:54 AM  Result Value Ref Range   Lipase 13 11 - 51 U/L  Surgical pcr screen     Status: None   Collection Time: 05/15/24 12:10 AM   Specimen: Nasal Mucosa; Nasal Swab  Result Value Ref Range   MRSA, PCR NEGATIVE NEGATIVE   Staphylococcus aureus NEGATIVE NEGATIVE  Basic metabolic panel with GFR     Status: Abnormal   Collection Time: 05/15/24  5:07 AM  Result Value Ref Range   Sodium 137 135 - 145 mmol/L   Potassium 4.0 3.5 - 5.1 mmol/L   Chloride 102 98 - 111 mmol/L   CO2 26 22 - 32 mmol/L   Glucose, Bld 99 70 - 99 mg/dL   BUN 24 (H) 8 - 23 mg/dL   Creatinine, Ser 8.96 0.61 - 1.24 mg/dL   Calcium 9.1 8.9 - 89.6 mg/dL   GFR, Estimated >39 >39 mL/min   Anion gap 9 5 - 15  CBC     Status: Abnormal   Collection Time: 05/15/24  5:07 AM  Result Value Ref Range   WBC 14.3 (H) 4.0 - 10.5 K/uL   RBC 5.04 4.22 - 5.81 MIL/uL   Hemoglobin 14.3 13.0 - 17.0 g/dL   HCT 53.8 60.9 - 47.9 %   MCV 91.5 80.0 - 100.0 fL   MCH 28.4 26.0 - 34.0 pg   MCHC 31.0 30.0 - 36.0 g/dL   RDW 85.8 88.4 - 84.4 %   Platelets 213 150 - 400 K/uL   nRBC 0.0 0.0 - 0.2 %    Assessment & Plan:  Present on Admission:  Acute appendicitis    LOS: 0 days   Additional comments:I reviewed the patient's new clinical lab test results.   and I reviewed the patients new imaging test results.    Acute appendicitis - plan lap appy. Informed consent was obtained  after detailed explanation of risks, including bleeding, infection, abscess, staple line leak, stump appendicitis, injury to surrounding structures, and need for conversion to open procedure. All questions answered to the patient's satisfaction. FEN - NPO except sips/chips DVT - SCDs, LMWH Dispo - med-surg    Dreama GEANNIE Hanger, MD Trauma & General Surgery Please use [email or web address] to contact on call provider  05/15/2024  *Care during the described time interval was provided by me. I have reviewed this patient's available data, including medical history, events of note, physical examination and test results as part of my evaluation.

## 2024-05-15 NOTE — Anesthesia Preprocedure Evaluation (Addendum)
 Anesthesia Evaluation  Patient identified by MRN, date of birth, ID band Patient awake    Reviewed: Allergy & Precautions, NPO status , Patient's Chart, lab work & pertinent test results  History of Anesthesia Complications Negative for: history of anesthetic complications (denies ponv)  Airway Mallampati: IV  TM Distance: >3 FB Neck ROM: Full    Dental  (+) Teeth Intact, Dental Advisory Given   Pulmonary neg pulmonary ROS   Pulmonary exam normal breath sounds clear to auscultation       Cardiovascular Normal cardiovascular exam Rhythm:Regular Rate:Normal     Neuro/Psych negative neurological ROS  negative psych ROS   GI/Hepatic Neg liver ROS, hiatal hernia,GERD  Medicated and Controlled,,  Endo/Other  negative endocrine ROS    Renal/GU negative Renal ROS  negative genitourinary   Musculoskeletal  (+) Arthritis , Osteoarthritis,    Abdominal   Peds  Hematology negative hematology ROS (+)   Anesthesia Other Findings   Reproductive/Obstetrics negative OB ROS                              Anesthesia Physical Anesthesia Plan  ASA: 2  Anesthesia Plan: General   Post-op Pain Management: Tylenol  PO (pre-op)*   Induction: Intravenous  PONV Risk Score and Plan: 3 and Ondansetron , Dexamethasone , Midazolam  and Treatment may vary due to age or medical condition  Airway Management Planned: Oral ETT  Additional Equipment: None  Intra-op Plan:   Post-operative Plan: Extubation in OR  Informed Consent: I have reviewed the patients History and Physical, chart, labs and discussed the procedure including the risks, benefits and alternatives for the proposed anesthesia with the patient or authorized representative who has indicated his/her understanding and acceptance.     Dental advisory given  Plan Discussed with: CRNA  Anesthesia Plan Comments: (Patient reports having anaphylaxis and  going into cardiac arrest with fentanyl  for a knee procedure at the beginning of 2023 at the [facility]. I do not have access to those records. Of the 3 anesthetics he has had here, including after that incident, he received fentanyl  without issue. I discussed this with the patient, but the patient could not give me any additional information on the event. He does request to not be given fentanyl .   Last airway note 2017: Ventilation: Mask ventilation without difficulty Tube type: Oral Tube size: 7.0 mm Number of attempts: 1 )         Anesthesia Quick Evaluation

## 2024-05-15 NOTE — Discharge Summary (Signed)
 [facility] Discharge Summary   Patient ID: Antonio Daniel MRN: [id number] DOB/AGE: 71-Jan-1952 71 y.o.  Admit date: 05/14/2024 Discharge date: 05/15/2024  Admitting Diagnosis: Acute appendicitis   Discharge Diagnosis Acute appendicitis   Consultants None   Imaging: CT ABDOMEN PELVIS W CONTRAST Result Date: 05/14/2024 CLINICAL DATA:  Right lower quadrant abdominal pain. EXAM: CT ABDOMEN AND PELVIS WITH CONTRAST TECHNIQUE: Multidetector CT imaging of the abdomen and pelvis was performed using the standard protocol following bolus administration of intravenous contrast. RADIATION DOSE REDUCTION: This exam was performed according to the departmental dose-optimization program which includes automated exposure control, adjustment of the mA and/or kV according to patient size and/or use of iterative reconstruction technique. CONTRAST:  OMNIPAQUE  IOHEXOL  300 MG/ML  SOLN COMPARISON:  CT and pelvis dated 06/24/2022. FINDINGS: Lower chest: Bibasilar linear atelectasis/scarring. No intra-abdominal free air or free fluid. Hepatobiliary: The liver is unremarkable. No biliary ductal dilatation. The gallbladder is unremarkable. Pancreas: Unremarkable. No pancreatic ductal dilatation or surrounding inflammatory changes. Spleen: Normal in size without focal abnormality. Adrenals/Urinary Tract: The adrenal glands unremarkable. There is no hydronephrosis on either side. There is symmetric enhancement and excretion of contrast by both kidneys. The visualized ureters and urinary bladder appear unremarkable. Stomach/Bowel: There is no bowel obstruction. Stones noted at the base and proximal appendix. The appendix is enlarged and inflamed measuring 12 mm in diameter. The appendix is located in the right lower quadrant inferior to the cecum. No abscess or perforation. Vascular/Lymphatic: Mild aortoiliac atherosclerotic disease. The IVC is unremarkable. No portal venous gas. There is no adenopathy.  Reproductive: The prostate and seminal vesicles are grossly remarkable. Other: None Musculoskeletal: Osteopenia with degenerative changes of the spine. No acute osseous pathology. IMPRESSION: 1. Acute appendicitis. No abscess or perforation. 2.  Aortic Atherosclerosis (ICD10-I70.0).  The Electronically Signed   By: Vanetta Chou M.D.   On: 05/14/2024 14:40    Procedures Dr. Dreama Hanger (05/15/24) - Laparoscopic Appendectomy  Hospital Course:  Patient is a 73 year old male who presented to [facility] with abdominal pain.  Workup showed acute appendicitis.  Patient was transferred and admitted to [facility] and underwent procedure listed above.  Tolerated procedure well and was transferred to the floor.  Patient discharged on POD0.  Patient will follow up in our office in 3-4 weeks and knows to call with questions or concerns.    I or a member of my team have reviewed this patient in the Controlled Substance Database.   Allergies as of 05/15/2024       Reactions   Fentanyl  Anaphylaxis   Oxycodone  Nausea And Vomiting   Other Reaction(s): Not available oxycodone         Medication List     TAKE these medications    acetaminophen  500 MG tablet Commonly known as: TYLENOL  Take 2 tablets (1,000 mg total) by mouth 4 (four) times daily.   ibuprofen  600 MG tablet Commonly known as: ADVIL  Take 1 tablet (600 mg total) by mouth 4 (four) times daily. What changed:  medication strength how much to take when to take this reasons to take this   methocarbamol  750 MG tablet Commonly known as: ROBAXIN  Take 1 tablet (750 mg total) by mouth 4 (four) times daily.   omeprazole  20 MG capsule Commonly known as: PRILOSEC Take 1 capsule (20 mg total) by mouth daily as needed (acid reflux). Please schedule a yearly follow up for further refills. Thank you   ondansetron  4 MG disintegrating tablet Commonly known as: ZOFRAN -ODT Take  1 tablet (4 mg total) by mouth every 8 (eight) hours as needed for nausea  or vomiting.   oxyCODONE  5 MG immediate release tablet Commonly known as: Roxicodone  Take 1 tablet (5 mg total) by mouth every 4 (four) hours as needed.   polyethylene glycol powder 17 GM/SCOOP powder Commonly known as: MiraLax  Take 17 g by mouth daily. Dissolve 1 capful (17g) in 4-8 ounces of liquid and take by mouth daily.          Follow-up Information     Maczis, Puja Gosai, PA-C. Go on 06/11/2024.   Specialty: General Surgery Why: 10:30 AM. Please plan to arrive 30 min prior to appointment time. Contact information: [address] [place] [address] [facility] [place] [address] [phone]                 Signed: Burnard JONELLE Louder , [facility] 05/15/2024, 1:01 PM Please see Amion for pager number during day hours 7:00am-4:30pm

## 2024-05-15 NOTE — Anesthesia Procedure Notes (Signed)
 Procedure Name: Intubation Date/Time: 05/15/2024 12:07 PM  Performed by: Obadiah Reyes BROCKS, CRNAPre-anesthesia Checklist: Patient identified, Emergency Drugs available, Suction available and Patient being monitored Patient Re-evaluated:Patient Re-evaluated prior to induction Oxygen Delivery Method: Circle System Utilized Preoxygenation: Pre-oxygenation with 100% oxygen Induction Type: IV induction Ventilation: Mask ventilation without difficulty Laryngoscope Size: McGrath and 3 Grade View: Grade II Tube type: Oral Tube size: 7.5 mm Number of attempts: 1 Airway Equipment and Method: Stylet and Oral airway Placement Confirmation: ETT inserted through vocal cords under direct vision, positive ETCO2 and breath sounds checked- equal and bilateral Secured at: 23 cm Tube secured with: Tape Dental Injury: Teeth and Oropharynx as per pre-operative assessment

## 2024-05-15 NOTE — Discharge Instructions (Signed)
 [facility]  LAPAROSCOPIC SURGERY: POST OP INSTRUCTIONS Always review your discharge instruction sheet given to you by the facility where your surgery was performed. IF YOU HAVE DISABILITY OR FAMILY LEAVE FORMS, YOU MUST BRING THEM TO THE OFFICE FOR PROCESSING.   DO NOT GIVE THEM TO YOUR DOCTOR.  PAIN CONTROL  Pain regimen: take over-the-counter tylenol  (acetaminophen ) 1000mg  every six hours, the prescription ibuprofen  (600mg ) every six hours and the robaxin  (methocarbamol ) 750mg  every six hours. With all three of these, you should be taking something every two hours. Example: tylenol  (acetaminophen ) at 8am, ibuprofen  at 10am, robaxin  (methocarbamol ) at 12pm, tylenol  (acetaminophen ) again at 2pm, ibuprofen  again at 4pm, robaxin  (methocarbamol ) at 6pm. You also have a prescription for oxycodone , which should be taken if the tylenol  (acetaminophen ), ibuprofen , and robaxin  (methocarbamol ) are not enough to control your pain. You may take the oxycodone  as frequently as every four hours as needed, but if you are taking the other medications as above, you should not need the oxycodone  this frequently. You have also been given a prescription for Miralax which is a stool softener. Please take this as prescribed because the oxycodone  can cause constipation and the Miralax will minimize or prevent constipation. Do not drive while taking or under the influence of the oxycodone  as it is a narcotic medication. Use ice packs to help control pain.  If you need a refill on your pain medication, please contact your pharmacy.  They will contact our office to request authorization. Prescriptions will not be filled after 5pm or on week-ends.  HOME MEDICATIONS Take your usually prescribed medications unless otherwise directed.  DIET You should follow a light diet the first few days after arrival home.  Be sure to include lots of fluids daily.  Do not consume alcohol while taking oxycodone  or  ibuprofen .   CONSTIPATION It is common to experience some constipation after surgery and if you are taking pain medication. Constipation will make your abdominal pain worse, so it is best to try to prevent it by increasing fluid intake and taking a stool softener. You have already been given a prescription for a mild laxative, Miralax, which you should be taking once daily. You can increase the Miralax to twice daily or even three times daily until you have a bowel movement. If still no bowel movement 24 hours after taking Miralax three times in one day, you may try magnesium  citrate, available over the counter at a local pharmacy.   WOUND/INCISION CARE Most patients will experience some swelling and bruising in the area of the incisions.  Ice packs will help.  Swelling and bruising can take several days to resolve.  May shower beginning 05/16/24.  Do not peel off or scrub skin glue. May allow warm soapy water to run over incision, then rinse and pat dry.  Do not soak in any water (tubs, hot tubs, pools, lakes, oceans) for one week.   ACTIVITIES You may resume regular (light) daily activities beginning the next day--such as daily self-care, walking, climbing stairs--gradually increasing activities as tolerated.  You may have sexual intercourse when it is comfortable.   No lifting greater than 5 pounds for six weeks.  You may drive when you are no longer taking narcotic pain medication, you can comfortably wear a seatbelt, and you can safely maneuver your car and apply brakes.  FOLLOW-UP You should see your doctor in the office for a follow-up appointment approximately 2-3 weeks after your surgery.  You should have  been given your post-op/follow-up appointment when your surgery was scheduled.  If you did not receive a post-op/follow-up appointment, make sure that you call for this appointment within a day or two after you arrive home to ensure a convenient appointment time.  WHEN TO CALL YOUR  DOCTOR: Fever over 101.5 Inability to urinate Continued bleeding from incision. Increased pain, redness, or drainage from the incision. Increasing abdominal pain  The clinic staff is available to answer your questions during regular business hours.  Please don't hesitate to call and ask to speak to one of the nurses for clinical concerns.  If you have a medical emergency, go to the nearest emergency room or call 911.  A surgeon from [facility] is always on call at the hospital. [address], [address], [place], [place]  [address] ? [address] [address], [place], [place]   [address] [phone] ? [phone] ? FAX [phone] Web site: [email or web address]

## 2024-05-16 ENCOUNTER — Encounter (HOSPITAL_COMMUNITY): Payer: Self-pay | Admitting: Surgery

## 2024-05-16 NOTE — Anesthesia Postprocedure Evaluation (Signed)
 Anesthesia Post Note  Patient: LUKASZ ROGUS  Procedure(s) Performed: APPENDECTOMY, LAPAROSCOPIC (Abdomen)     Patient location during evaluation: PACU Anesthesia Type: General Level of consciousness: awake and alert, oriented and patient cooperative Pain management: pain level controlled Vital Signs Assessment: post-procedure vital signs reviewed and stable Respiratory status: spontaneous breathing, nonlabored ventilation and respiratory function stable Cardiovascular status: blood pressure returned to baseline and stable Postop Assessment: no apparent nausea or vomiting Anesthetic complications: no   No notable events documented.  Last Vitals:  Vitals:   05/15/24 1410 05/15/24 1517  BP: 122/64 123/68  Pulse: 64 68  Resp: 18 16  Temp: 37 C (!) 36.4 C  SpO2: 95% 96%    Last Pain:  Vitals:   05/15/24 1549  TempSrc:   PainSc: 6                  Almarie CHRISTELLA Marchi

## 2024-05-17 LAB — SURGICAL PATHOLOGY
